# Patient Record
Sex: Male | Born: 1960 | Race: White | Hispanic: No | Marital: Married | Smoking: Former smoker
Health system: Southern US, Community
[De-identification: ages and names within clinical notes are randomized; demographics above are authoritative.]

## PROBLEM LIST (undated history)

## (undated) DIAGNOSIS — I10 Essential (primary) hypertension: Secondary | ICD-10-CM

## (undated) DIAGNOSIS — Z9889 Other specified postprocedural states: Secondary | ICD-10-CM

## (undated) DIAGNOSIS — I4891 Unspecified atrial fibrillation: Secondary | ICD-10-CM

## (undated) DIAGNOSIS — I509 Heart failure, unspecified: Secondary | ICD-10-CM

## (undated) DIAGNOSIS — Z95810 Presence of automatic (implantable) cardiac defibrillator: Secondary | ICD-10-CM

## (undated) DIAGNOSIS — E119 Type 2 diabetes mellitus without complications: Secondary | ICD-10-CM

## (undated) DIAGNOSIS — B1920 Unspecified viral hepatitis C without hepatic coma: Secondary | ICD-10-CM

## (undated) HISTORY — DX: Other specified postprocedural states: Z98.890

## (undated) HISTORY — PX: VALVE REPLACEMENT: SUR13

## (undated) HISTORY — PX: KNEE SURGERY: SHX244

## (undated) HISTORY — DX: Unspecified atrial fibrillation: I48.91

## (undated) HISTORY — DX: Heart failure, unspecified: I50.9

## (undated) HISTORY — PX: ICD IMPLANT: EP1208

## (undated) HISTORY — DX: Essential (primary) hypertension: I10

## (undated) HISTORY — DX: Presence of automatic (implantable) cardiac defibrillator: Z95.810

---

## 2022-05-14 ENCOUNTER — Telehealth: Payer: Self-pay

## 2022-05-14 NOTE — Telephone Encounter (Signed)
NOTES SCANNED TO REFERRAL 

## 2022-05-28 ENCOUNTER — Telehealth: Payer: Self-pay | Admitting: Internal Medicine

## 2022-05-28 ENCOUNTER — Emergency Department (HOSPITAL_COMMUNITY)
Admission: EM | Admit: 2022-05-28 | Discharge: 2022-05-28 | Disposition: A | Discharge status: Home / Self Care | Payer: Self-pay | Attending: Emergency Medicine | Admitting: Emergency Medicine

## 2022-05-28 ENCOUNTER — Emergency Department (HOSPITAL_COMMUNITY): Payer: Self-pay

## 2022-05-28 ENCOUNTER — Other Ambulatory Visit: Payer: Self-pay

## 2022-05-28 ENCOUNTER — Encounter (HOSPITAL_COMMUNITY): Payer: Self-pay

## 2022-05-28 DIAGNOSIS — Z7984 Long term (current) use of oral hypoglycemic drugs: Secondary | ICD-10-CM | POA: Insufficient documentation

## 2022-05-28 DIAGNOSIS — I4892 Unspecified atrial flutter: Secondary | ICD-10-CM

## 2022-05-28 DIAGNOSIS — R0602 Shortness of breath: Secondary | ICD-10-CM | POA: Insufficient documentation

## 2022-05-28 DIAGNOSIS — Z79899 Other long term (current) drug therapy: Secondary | ICD-10-CM | POA: Insufficient documentation

## 2022-05-28 DIAGNOSIS — E119 Type 2 diabetes mellitus without complications: Secondary | ICD-10-CM | POA: Insufficient documentation

## 2022-05-28 DIAGNOSIS — I4891 Unspecified atrial fibrillation: Secondary | ICD-10-CM | POA: Insufficient documentation

## 2022-05-28 DIAGNOSIS — R5383 Other fatigue: Secondary | ICD-10-CM | POA: Insufficient documentation

## 2022-05-28 DIAGNOSIS — R002 Palpitations: Secondary | ICD-10-CM | POA: Insufficient documentation

## 2022-05-28 HISTORY — DX: Type 2 diabetes mellitus without complications: E11.9

## 2022-05-28 HISTORY — DX: Unspecified viral hepatitis C without hepatic coma: B19.20

## 2022-05-28 LAB — TROPONIN I (HIGH SENSITIVITY)
Troponin I (High Sensitivity): 7 ng/L (ref <18)
Troponin I (High Sensitivity): 9 ng/L (ref <18)

## 2022-05-28 LAB — HEPATIC FUNCTION PANEL
ALT: 23 U/L (ref 0–44)
AST: 17 U/L (ref 15–41)
Albumin: 3.8 g/dL (ref 3.5–5.0)
Alkaline Phosphatase: 59 U/L (ref 38–126)
Bilirubin, Direct: 0.1 mg/dL (ref 0.0–0.2)
Indirect Bilirubin: 0.8 mg/dL (ref 0.3–0.9)
Total Bilirubin: 0.9 mg/dL (ref 0.3–1.2)
Total Protein: 7.5 g/dL (ref 6.5–8.1)

## 2022-05-28 LAB — CBC
HCT: 41.3 % (ref 39.0–52.0)
Hemoglobin: 14.3 g/dL (ref 13.0–17.0)
MCH: 32.5 pg (ref 26.0–34.0)
MCHC: 34.6 g/dL (ref 30.0–36.0)
MCV: 93.9 fL (ref 80.0–100.0)
Platelets: 137 10*3/uL — ABNORMAL LOW (ref 150–400)
RBC: 4.4 MIL/uL (ref 4.22–5.81)
RDW: 12.4 % (ref 11.5–15.5)
WBC: 8.2 10*3/uL (ref 4.0–10.5)
nRBC: 0 % (ref 0.0–0.2)

## 2022-05-28 LAB — BASIC METABOLIC PANEL
Anion gap: 10 (ref 5–15)
BUN: 14 mg/dL (ref 8–23)
CO2: 21 mmol/L — ABNORMAL LOW (ref 22–32)
Calcium: 9.1 mg/dL (ref 8.9–10.3)
Chloride: 104 mmol/L (ref 98–111)
Creatinine, Ser: 0.86 mg/dL (ref 0.61–1.24)
GFR, Estimated: >60 mL/min (ref >60)
Glucose, Bld: 124 mg/dL — ABNORMAL HIGH (ref 70–99)
Potassium: 4.1 mmol/L (ref 3.5–5.1)
Sodium: 135 mmol/L (ref 135–145)

## 2022-05-28 LAB — HEPATITIS PANEL, ACUTE
HCV Ab: REACTIVE — AB
Hep A IgM: NONREACTIVE
Hep B C IgM: NONREACTIVE
Hepatitis B Surface Ag: NONREACTIVE

## 2022-05-28 LAB — PROTIME-INR
INR: 1.3 — ABNORMAL HIGH (ref 0.8–1.2)
Prothrombin Time: 15.7 seconds — ABNORMAL HIGH (ref 11.4–15.2)

## 2022-05-28 LAB — D-DIMER, QUANTITATIVE: D-Dimer, Quant: 0.39 ug/mL-FEU (ref 0.00–0.50)

## 2022-05-28 MED ORDER — ETOMIDATE 2 MG/ML IV SOLN
INTRAVENOUS | Status: AC | PRN
  Administered 2022-05-28: 10 mg via INTRAVENOUS

## 2022-05-28 MED ORDER — DILTIAZEM HCL 25 MG/5ML IV SOLN
20.0000 mg | Freq: Once | INTRAVENOUS | Status: AC
  Administered 2022-05-28: 20 mg via INTRAVENOUS
  Filled 2022-05-28: qty 5

## 2022-05-28 MED ORDER — ONDANSETRON HCL 4 MG/2ML IJ SOLN
4.0000 mg | Freq: Once | INTRAMUSCULAR | Status: AC
  Administered 2022-05-28: 4 mg via INTRAVENOUS
  Filled 2022-05-28: qty 2

## 2022-05-28 MED ORDER — LACTATED RINGERS IV BOLUS
1000.0000 mL | Freq: Once | INTRAVENOUS | Status: AC
  Administered 2022-05-28: 1000 mL via INTRAVENOUS

## 2022-05-28 MED ORDER — FENTANYL CITRATE PF 50 MCG/ML IJ SOSY
50.0000 ug | PREFILLED_SYRINGE | Freq: Once | INTRAMUSCULAR | Status: AC
  Administered 2022-05-28: 50 ug via INTRAVENOUS
  Filled 2022-05-28: qty 1

## 2022-05-28 MED ORDER — SODIUM CHLORIDE 0.9 % IV SOLN: INTRAVENOUS | Status: DC

## 2022-05-28 MED ORDER — ETOMIDATE 2 MG/ML IV SOLN
10.0000 mg | Freq: Once | INTRAVENOUS | Status: DC
  Filled 2022-05-28: qty 10

## 2022-05-28 NOTE — Telephone Encounter (Signed)
Spoke with pt who has not yet established care with this cardiology practice.  Pt is scheduled to see Dr Caryl Comes 06/10/2022 to follow his pacemaker.  Pt has recently moved from [place].  Pt states his HR has been around 115 for the past 18 hours.  Pt denies current CP, SOB, dizziness but complains of feeling fatigued.  He states 6 weeks ago he was advised he was in SVT by his device clinic in [place].  He states he takes Lisinopril and Carvedilol.   Pt advised to send a device transmission and contact device clinic in [place] for further direction as to his current rhythm. Pt advised if he develops active CP, SOB, fainting or is advised he is in SVT he will need to go directly to the ED for further evaluation.  Pt verbalizes understanding and will follow up with Cardiology practice accordingly.

## 2022-05-28 NOTE — ED Notes (Signed)
Lab to add on INR

## 2022-05-28 NOTE — Discharge Instructions (Addendum)
You presented with atrial flutter with rapid ventricular response and underwent cardioversion in the emergency department as her symptoms had lasted no more than slightly over 24 hours.  Cardiology will follow-up and see you in clinic next week.  Recommend you restart your home Eliquis and continue your home Coreg.  Return for worsening chest pain, shortness of breath, fatigue, heart palpitations and elevated heart rate. Follow-up on our online portal regarding your hepatitis panel results and discuss with your PCP.

## 2022-05-28 NOTE — Telephone Encounter (Signed)
STAT if HR is under 50 or over 120 (normal HR is 60-100 beats per minute)  What is your heart rate? 115 for the past 15 hours  Do you have a log of your heart rate readings (document readings)? 115 said it's been that way since 3pm yesterday.  Do you have any other symptoms? No other symptoms.

## 2022-05-28 NOTE — ED Notes (Signed)
Patient verbalizes understanding of discharge instructions. Opportunity for questioning and answers were provided. Armband removed by staff, pt discharged from ED pt was wheeled out to the lobby and left in the care of his family.

## 2022-05-28 NOTE — ED Notes (Signed)
Vitals reported at 1838 are incorrect, for different patient

## 2022-05-28 NOTE — ED Triage Notes (Signed)
Pt arrived POV from home c/o his heart racing. Pt states his heart rate has been 120 for the last 24 hrs. Pt states that his chest is sore from it beating so fast but it does not actually hurt. Pt states he recently moved here from [place].

## 2022-05-28 NOTE — ED Notes (Signed)
Signed consent for cardioversion at bedside

## 2022-05-28 NOTE — Sedation Documentation (Signed)
Shock at [facility]

## 2022-05-28 NOTE — ED Provider Notes (Signed)
  [facility] EMERGENCY DEPARTMENT Provider Note   CSN: [id number] Arrival date & time: 05/28/22  1611     History {Add pertinent medical, surgical, social history, OB history to HPI:1} Chief Complaint  Patient presents with   Tachycardia    Corey Perkins is a 61 y.o. male.  HPI     Home Medications Prior to Admission medications   Not on File      Allergies    Heparin    Review of Systems   Review of Systems  Physical Exam Updated Vital Signs BP (!) 124/96   Pulse (!) 118   Temp 98.7 F (37.1 C) (Oral)   Resp 17   Ht 6\' 1"  (1.854 m)   Wt 99.8 kg   SpO2 99%   BMI 29.03 kg/m  Physical Exam  ED Results / Procedures / Treatments   Labs (all labs ordered are listed, but only abnormal results are displayed) Labs Reviewed  BASIC METABOLIC PANEL - Abnormal; Notable for the following components:      Result Value   CO2 21 (*)    Glucose, Bld 124 (*)    All other components within normal limits  CBC - Abnormal; Notable for the following components:   Platelets 137 (*)    All other components within normal limits    EKG EKG Interpretation  Date/Time:  Tuesday May 28 2022 16:18:48 EDT Ventricular Rate:  119 PR Interval:    QRS Duration: 88 QT Interval:  330 QTC Calculation: 464 R Axis:   87 Text Interpretation: Atrial flutter with 2:1 A-V conduction Abnormal ECG No previous ECGs available Confirmed by Regan Lemming ([id number]) on 05/28/2022 7:04:51 PM  Radiology DG Chest 2 View  Result Date: 05/28/2022 CLINICAL DATA:  Tachycardia and chest pain EXAM: CHEST - 2 VIEW COMPARISON:  None Available. FINDINGS: Cardiac and mediastinal contours are within normal limits. Left chest wall AICD with leads projecting over the expected area of the right atrium and right ventricle. Lungs are clear. No pleural effusion or pneumothorax. IMPRESSION: No active cardiopulmonary disease. Electronically Signed   By: Yetta Glassman M.D.   On: 05/28/2022 17:15     Procedures Procedures  {Document cardiac monitor, telemetry assessment procedure when appropriate:1}  Medications Ordered in ED Medications - No data to display  ED Course/ Medical Decision Making/ A&P                           Medical Decision Making Amount and/or Complexity of Data Reviewed Labs: ordered. Radiology: ordered.   ***  {Document critical care time when appropriate:1} {Document review of labs and clinical decision tools ie heart score, Chads2Vasc2 etc:1}  {Document your independent review of radiology images, and any outside records:1} {Document your discussion with family members, caretakers, and with consultants:1} {Document social determinants of health affecting pt's care:1} {Document your decision making why or why not admission, treatments were needed:1} Final Clinical Impression(s) / ED Diagnoses Final diagnoses:  None    Rx / DC Orders ED Discharge Orders     None

## 2022-05-28 NOTE — ED Notes (Signed)
RT called for cardioversion

## 2022-05-28 NOTE — ED Notes (Signed)
Pt placed on pads at this time  

## 2022-05-30 ENCOUNTER — Telehealth: Payer: Self-pay | Admitting: Internal Medicine

## 2022-05-30 LAB — HCV RNA QUANT: HCV Quantitative: NOT DETECTED IU/mL (ref >50)

## 2022-05-30 NOTE — Telephone Encounter (Signed)
Patient called stating he went to the ED the other day and he was in A-flutter they did a cardioversion on him.  They advised him to give [place] a called to see if he needed to be seen before 6/12.

## 2022-05-31 NOTE — Telephone Encounter (Signed)
Spoke with pt who reports he is doing well after his cardioversion in the ED on 5/30.  Pt advised he should plan to keep f/u appointment scheduled for 06/10/2022 with Dr Graciela Husbands.  Pt advised to contact office for any needs between now and the appointment.  Pt verbalizes understanding and agrees with current plan.

## 2022-06-02 ENCOUNTER — Encounter (HOSPITAL_COMMUNITY): Payer: Self-pay

## 2022-06-02 ENCOUNTER — Other Ambulatory Visit: Payer: Self-pay

## 2022-06-02 ENCOUNTER — Inpatient Hospital Stay (HOSPITAL_COMMUNITY)
Admission: EM | Admit: 2022-06-02 | Discharge: 2022-06-06 | DRG: 310 | Disposition: A | Discharge status: Home / Self Care | Payer: BLUE CROSS/BLUE SHIELD | Attending: Cardiovascular Disease | Admitting: Cardiovascular Disease

## 2022-06-02 ENCOUNTER — Inpatient Hospital Stay (HOSPITAL_COMMUNITY): Payer: BLUE CROSS/BLUE SHIELD

## 2022-06-02 DIAGNOSIS — E119 Type 2 diabetes mellitus without complications: Secondary | ICD-10-CM | POA: Diagnosis present

## 2022-06-02 DIAGNOSIS — Z7901 Long term (current) use of anticoagulants: Secondary | ICD-10-CM

## 2022-06-02 DIAGNOSIS — Z833 Family history of diabetes mellitus: Secondary | ICD-10-CM

## 2022-06-02 DIAGNOSIS — I509 Heart failure, unspecified: Secondary | ICD-10-CM | POA: Diagnosis present

## 2022-06-02 DIAGNOSIS — Z7984 Long term (current) use of oral hypoglycemic drugs: Secondary | ICD-10-CM

## 2022-06-02 DIAGNOSIS — I110 Hypertensive heart disease with heart failure: Secondary | ICD-10-CM | POA: Diagnosis present

## 2022-06-02 DIAGNOSIS — Z9889 Other specified postprocedural states: Secondary | ICD-10-CM | POA: Diagnosis not present

## 2022-06-02 DIAGNOSIS — Z8249 Family history of ischemic heart disease and other diseases of the circulatory system: Secondary | ICD-10-CM

## 2022-06-02 DIAGNOSIS — F1011 Alcohol abuse, in remission: Secondary | ICD-10-CM | POA: Diagnosis present

## 2022-06-02 DIAGNOSIS — Z4502 Encounter for adjustment and management of automatic implantable cardiac defibrillator: Secondary | ICD-10-CM | POA: Diagnosis not present

## 2022-06-02 DIAGNOSIS — I484 Atypical atrial flutter: Secondary | ICD-10-CM | POA: Diagnosis present

## 2022-06-02 DIAGNOSIS — Z952 Presence of prosthetic heart valve: Secondary | ICD-10-CM

## 2022-06-02 DIAGNOSIS — Z888 Allergy status to other drugs, medicaments and biological substances status: Secondary | ICD-10-CM

## 2022-06-02 DIAGNOSIS — I4819 Other persistent atrial fibrillation: Principal | ICD-10-CM | POA: Diagnosis present

## 2022-06-02 DIAGNOSIS — I4901 Ventricular fibrillation: Secondary | ICD-10-CM | POA: Diagnosis present

## 2022-06-02 DIAGNOSIS — R9431 Abnormal electrocardiogram [ECG] [EKG]: Secondary | ICD-10-CM

## 2022-06-02 DIAGNOSIS — I428 Other cardiomyopathies: Secondary | ICD-10-CM | POA: Diagnosis present

## 2022-06-02 DIAGNOSIS — I2510 Atherosclerotic heart disease of native coronary artery without angina pectoris: Secondary | ICD-10-CM | POA: Diagnosis present

## 2022-06-02 DIAGNOSIS — Z79899 Other long term (current) drug therapy: Secondary | ICD-10-CM | POA: Diagnosis not present

## 2022-06-02 LAB — CBC
HCT: 38.8 % — ABNORMAL LOW (ref 39.0–52.0)
Hemoglobin: 13.6 g/dL (ref 13.0–17.0)
MCH: 32.5 pg (ref 26.0–34.0)
MCHC: 35.1 g/dL (ref 30.0–36.0)
MCV: 92.6 fL (ref 80.0–100.0)
Platelets: 110 10*3/uL — ABNORMAL LOW (ref 150–400)
RBC: 4.19 MIL/uL — ABNORMAL LOW (ref 4.22–5.81)
RDW: 12.3 % (ref 11.5–15.5)
WBC: 8.2 10*3/uL (ref 4.0–10.5)
nRBC: 0 % (ref 0.0–0.2)

## 2022-06-02 LAB — HEPATIC FUNCTION PANEL
ALT: 25 U/L (ref 0–44)
AST: 18 U/L (ref 15–41)
Albumin: 3.7 g/dL (ref 3.5–5.0)
Alkaline Phosphatase: 61 U/L (ref 38–126)
Bilirubin, Direct: 0.1 mg/dL (ref 0.0–0.2)
Indirect Bilirubin: 0.6 mg/dL (ref 0.3–0.9)
Total Bilirubin: 0.7 mg/dL (ref 0.3–1.2)
Total Protein: 7.5 g/dL (ref 6.5–8.1)

## 2022-06-02 LAB — BASIC METABOLIC PANEL
Anion gap: 7 (ref 5–15)
BUN: 16 mg/dL (ref 8–23)
CO2: 24 mmol/L (ref 22–32)
Calcium: 9.1 mg/dL (ref 8.9–10.3)
Chloride: 102 mmol/L (ref 98–111)
Creatinine, Ser: 0.97 mg/dL (ref 0.61–1.24)
GFR, Estimated: >60 mL/min (ref >60)
Glucose, Bld: 175 mg/dL — ABNORMAL HIGH (ref 70–99)
Potassium: 4 mmol/L (ref 3.5–5.1)
Sodium: 133 mmol/L — ABNORMAL LOW (ref 135–145)

## 2022-06-02 LAB — BRAIN NATRIURETIC PEPTIDE: B Natriuretic Peptide: 105.7 pg/mL — ABNORMAL HIGH (ref 0.0–100.0)

## 2022-06-02 LAB — TROPONIN I (HIGH SENSITIVITY)
Troponin I (High Sensitivity): 11 ng/L (ref <18)
Troponin I (High Sensitivity): 17 ng/L (ref <18)

## 2022-06-02 LAB — TSH: TSH: 3.096 u[IU]/mL (ref 0.350–4.500)

## 2022-06-02 LAB — HIV ANTIBODY (ROUTINE TESTING W REFLEX): HIV Screen 4th Generation wRfx: NONREACTIVE

## 2022-06-02 LAB — CBG MONITORING, ED: Glucose-Capillary: 195 mg/dL — ABNORMAL HIGH (ref 70–99)

## 2022-06-02 LAB — T4, FREE: Free T4: 0.83 ng/dL (ref 0.61–1.12)

## 2022-06-02 LAB — MAGNESIUM: Magnesium: 2 mg/dL (ref 1.7–2.4)

## 2022-06-02 MED ORDER — ONDANSETRON HCL 4 MG/2ML IJ SOLN: 4.0000 mg | Freq: Four times a day (QID) | INTRAMUSCULAR | Status: DC | PRN

## 2022-06-02 MED ORDER — ASPIRIN 81 MG PO CHEW
324.0000 mg | CHEWABLE_TABLET | Freq: Once | ORAL | Status: AC
  Administered 2022-06-02: 324 mg via ORAL
  Filled 2022-06-02: qty 4

## 2022-06-02 MED ORDER — AMIODARONE LOAD VIA INFUSION
150.0000 mg | Freq: Once | INTRAVENOUS | Status: AC
  Administered 2022-06-02: 150 mg via INTRAVENOUS
  Filled 2022-06-02: qty 83.34

## 2022-06-02 MED ORDER — LISINOPRIL 10 MG PO TABS
10.0000 mg | ORAL_TABLET | Freq: Every day | ORAL | Status: DC
  Administered 2022-06-03 – 2022-06-06 (×4): 10 mg via ORAL
  Filled 2022-06-02 (×4): qty 1

## 2022-06-02 MED ORDER — APIXABAN 5 MG PO TABS
5.0000 mg | ORAL_TABLET | Freq: Two times a day (BID) | ORAL | Status: DC
  Administered 2022-06-02 – 2022-06-06 (×8): 5 mg via ORAL
  Filled 2022-06-02 (×8): qty 1

## 2022-06-02 MED ORDER — ACETAMINOPHEN 325 MG PO TABS
650.0000 mg | ORAL_TABLET | ORAL | Status: DC | PRN
  Filled 2022-06-02: qty 2

## 2022-06-02 MED ORDER — AMIODARONE HCL IN DEXTROSE 360-4.14 MG/200ML-% IV SOLN
60.0000 mg/h | INTRAVENOUS | Status: DC
  Administered 2022-06-02: 60 mg/h via INTRAVENOUS
  Filled 2022-06-02: qty 200

## 2022-06-02 MED ORDER — METFORMIN HCL 500 MG PO TABS
1000.0000 mg | ORAL_TABLET | Freq: Two times a day (BID) | ORAL | Status: DC
  Administered 2022-06-02 – 2022-06-06 (×7): 1000 mg via ORAL
  Filled 2022-06-02 (×7): qty 2

## 2022-06-02 MED ORDER — AMIODARONE HCL IN DEXTROSE 360-4.14 MG/200ML-% IV SOLN
30.0000 mg/h | INTRAVENOUS | Status: DC
  Administered 2022-06-02 – 2022-06-05 (×6): 30 mg/h via INTRAVENOUS
  Filled 2022-06-02 (×6): qty 200

## 2022-06-02 MED ORDER — CARVEDILOL 25 MG PO TABS
25.0000 mg | ORAL_TABLET | Freq: Two times a day (BID) | ORAL | Status: DC
  Administered 2022-06-02 – 2022-06-06 (×8): 25 mg via ORAL
  Filled 2022-06-02 (×2): qty 2
  Filled 2022-06-02: qty 1
  Filled 2022-06-02 (×2): qty 2
  Filled 2022-06-02: qty 1
  Filled 2022-06-02 (×2): qty 2

## 2022-06-02 NOTE — Progress Notes (Signed)
  Amiodarone Drug - Drug Interaction Consult Note  Recommendations: Continue as ordered. Monitor for QTc interactions with PRN zofran. Amiodarone is metabolized by the cytochrome P450 system and therefore has the potential to cause many drug interactions. Amiodarone has an average plasma half-life of 50 days (range 20 to 100 days).   There is potential for drug interactions to occur several weeks or months after stopping treatment and the onset of drug interactions may be slow after initiating amiodarone.   []  Statins: Increased risk of myopathy. Simvastatin- restrict dose to 20mg  daily. Other statins: counsel patients to report any muscle pain or weakness immediately.  [x]  Anticoagulants: Amiodarone can increase anticoagulant effect. Consider warfarin dose reduction. Patients should be monitored closely and the dose of anticoagulant altered accordingly, remembering that amiodarone levels take several weeks to stabilize.             Apixaban.  []  Antiepileptics: Amiodarone can increase plasma concentration of phenytoin, the dose should be reduced. Note that small changes in phenytoin dose can result in large changes in levels. Monitor patient and counsel on signs of toxicity.  [x]  Beta blockers: increased risk of bradycardia, AV block and myocardial depression. Sotalol - avoid concomitant use.  []   Calcium channel blockers (diltiazem and verapamil): increased risk of bradycardia, AV block and myocardial depression.  []   Cyclosporine: Amiodarone increases levels of cyclosporine. Reduced dose of cyclosporine is recommended.  []  Digoxin dose should be halved when amiodarone is started.  []  Diuretics: increased risk of cardiotoxicity if hypokalemia occurs.  []  Oral hypoglycemic agents (glyburide, glipizide, glimepiride): increased risk of hypoglycemia. Patient's glucose levels should be monitored closely when initiating amiodarone therapy.   [x]  Drugs that prolong the QT interval:  Torsades  de pointes risk may be increased with concurrent use - avoid if possible.  Monitor QTc, also keep magnesium/potassium WNL if concurrent therapy can't be avoided.  Antibiotics: e.g. fluoroquinolones, erythromycin.  Antiarrhythmics: e.g. quinidine, procainamide, disopyramide, sotalol.  Antipsychotics: e.g. phenothiazines, haloperidol.   Lithium, tricyclic antidepressants, and methadone. Thank You,   06/02/2022 5:12 PM

## 2022-06-02 NOTE — H&P (Signed)
Cardiology Admission History and Physical:   Patient ID: Corey Perkins MRN: [id number]; DOB: 1961/05/16   Admission date: 06/02/2022  PCP:  Pcp, No   [facility] Providers Cardiologist:  None   { Click here to update MD or APP on Care Team, Refresh:1}     Chief Complaint: ICD discharge/atrial flutter  Patient Profile:   Corey Perkins is a 61 y.o. male with history of mitral valve repair and recurrent atrial tachyarrhythmia  who is being seen 06/02/2022 for the evaluation of defibrillator discharge for atrial flutter with 1: 1 AV conduction.  History of Present Illness:   Corey Perkins is originally from [place] but has lived in [place] for the last over 20 years.  He has recently moved back to [place] to be closer to grandchildren.  Around 1415h this afternoon, during light activity he experienced sudden onset of weakness and near syncope after which he received a defibrillator shock.  He "usually feels better after the shock", but this time he does not feel that he has improved.  He has had inappropriate defibrillator shocks for atrial arrhythmia before, in September 2022 (13 consecutive shocks).   Interrogation of his defibrillator shows that he had 3 episodes of nonsustained atrial flutter with 1:1 AV conduction that resolved spontaneously around 1412-1414h this afternoon but then at 1415h he had a sustained episode at 226 bpm, placing him in the detection zone for ventricular fibrillation (greater than 222 bpm).  ATP during charge was unsuccessful and he received a single high-voltage shock with conversion to normal rhythm.  However, about 45 minutes later he again converted to atrial flutter, thankfully this time with 2: 1 AV conduction, which is the rhythm in which he presents in to the emergency room with a ventricular rate of 113 bpm.  This probably explains why he does not feel that he has improved.  He was previously seen in our emergency room on 05/28/2022 with atrial flutter  with rapid ventricular response that has been going on for about 24 hours and underwent semielective cardioversion.  He is due to see Dr. Caryl Comes for his first cardiology appointment since moving to [facility] on 06/10/2022.  He is not yet enrolled in the [facility] heart care device clinic.  Notes that are scanned into our computer show that he had multiple episodes of atrial flutter with rapid ventricular response on May 10 as well.  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  He does not have coronary disease by previous catheterization in 2015 (minor luminal irregularities), although a PET scan in October 2021 did show a very small reversible defect (rest EF 53%, stress EF 64%).  In October 2016 he underwent implantation of a dual-chamber St Jude fortify Assura DR ICD for primary prevention.  There is an alert note in the ICD that he is "atrially pacemaker dependent), but this is not accurate.  He only paces the atrium roughly 60% of the time.  He does not require ventricular pacing.  Around the same time he underwent pulmonary vein isolation ablation.  In September 2022 he underwent another ablation procedure: Cavotricuspid isthmus ablation for typical atrial flutter, which was felt to be the cause of his numerous episodes of inappropriate ICD shocks.  He reports a history of being "allergic" to heparin, a problem that was discovered when he underwent his open heart surgery.  He had to be treated with a different anticoagulant that was "hard to find".  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  This diagnosis is not mentioned in his records.  He has been treated successfully with amiodarone in the past.  The medication was interrupted since it was felt to be no longer necessary.  It was not interrupted due to  side effects.  He has a history of hypertension and alcohol use/abuse and did have elevated LFTs in the past, but not recently. Hep C Ab +ve, RNA undetectable.  He has a history of knee arthroplasty.   Past Medical History:  Diagnosis Date   Diabetes mellitus without complication ([place])    Hepatitis C     Past Surgical History:  Procedure Laterality Date   ICD IMPLANT     KNEE SURGERY     VALVE REPLACEMENT       Medications Prior to Admission: Prior to Admission medications   Medication Sig Start Date End Date Taking? Authorizing Provider  carvedilol (COREG) 25 MG tablet Take 25 mg by mouth 2 (two) times daily. 03/27/22   [provider]  ELIQUIS 5 MG TABS tablet Take 5 mg by mouth 2 (two) times daily. 01/15/22   [provider]  lisinopril (ZESTRIL) 10 MG tablet Take 10 mg by mouth daily. 03/29/22   [provider]  metFORMIN (GLUCOPHAGE) 1000 MG tablet Take 1,000 mg by mouth 2 (two) times daily. 04/21/22   [provider]  Multiple Vitamins-Minerals (CENTRUM MEN) TABS Take 1 tablet by mouth daily.    [provider]     Allergies:    Allergies  Allergen Reactions   Heparin Hives    Social History:   Social History   Socioeconomic History   Marital status: Married    Spouse name: Not on file   Number of children: Not on file   Years of education: Not on file   Highest education level: Not on file  Occupational History   Not on file  Tobacco Use   Smoking status: Never   Smokeless tobacco: Never  Substance and Sexual Activity   Alcohol use: Never   Drug use: Never   Sexual activity: Not on file  Other Topics Concern   Not on file  Social History Narrative   Not on file   Social Determinants of Health   Financial Resource Strain: Not on file  Food Insecurity: Not on file  Transportation Needs: Not on file  Physical Activity: Not on file  Stress: Not on file  Social Connections: Not on file  Intimate Partner  Violence: Not on file    Family History: Father had open heart surgery (CABG) and had diabetes mellitus, mother had hypertension and cancer.   ROS:  Please see the history of present illness.  All other ROS reviewed and negative.     Physical Exam/Data:   Vitals:   06/02/22 1603  BP: (!) 164/102  Pulse: (!) 113  Resp: 16  Temp: 98.9 F (37.2 C)  TempSrc: Oral  SpO2: 100%   No intake or output data in the 24 hours ending 06/02/22 1737    05/28/2022    4:22 PM  Last 3 Weights  Weight (lbs) 220 lb  Weight (kg) 99.791 kg     There is no height or weight on file to calculate BMI.  General:  Well nourished, well developed, in no acute distress.  He appears quite fit.  He is anxious. HEENT: normal Neck:  no JVD Vascular: No carotid bruits; Distal pulses 2+ bilaterally   Cardiac:  normal S1, S2; RRR; no murmur .  He has a well-healed sternotomy scar. Lungs:  clear to auscultation bilaterally, no wheezing, rhonchi or rales  Abd: soft, nontender, no hepatomegaly  Ext: no edema Musculoskeletal:  No deformities, BUE and BLE strength normal and equal Skin: warm and dry  Neuro:  CNs 2-12 intact, no focal abnormalities noted Psych:  Normal affect    EKG:  The ECG that was done  was personally reviewed and demonstrates atrial flutter with 2: 1 AV conduction and delayed R wave progression in the anterior leads.  QTc is borderline prolonged at 480 ms but this is probably exaggerated by the flutter waves.  Relevant CV Studies: Comprehensive interrogation of his pacemaker today shows normal device function.  Estimated battery longevity 2.1 years.  Normal sensing on the atrial and ventricular channels, normal lead impedances.  Did not perform atrial threshold testing due to ongoing atrial flutter.  He only has 60% atrial pacing and 1% ventricular pacing.  The overall burden of atrial tachycardia/atrial fibrillation is 3.9%.  There is a note in the chart that he is "atrially dependent" but  this does not appear to be accurate since he has atrial sensed rhythm documented on multiple recent tracings, presumably sinus rhythm.  Laboratory Data:  High Sensitivity Troponin:   Recent Labs  Lab 05/28/22 1938 05/28/22 2059 06/02/22 1614  TROPONINIHS 7 9 11       Chemistry Recent Labs  Lab 05/28/22 1635 06/02/22 1614  NA 135 133*  K 4.1 4.0  CL 104 102  CO2 21* 24  GLUCOSE 124* 175*  BUN 14 16  CREATININE 0.86 0.97  CALCIUM 9.1 9.1  GFRNONAA >60 >60  ANIONGAP 10 7    Recent Labs  Lab 05/28/22 1938  PROT 7.5  ALBUMIN 3.8  AST 17  ALT 23  ALKPHOS 59  BILITOT 0.9   Lipids No results for input(s): CHOL, TRIG, HDL, LABVLDL, LDLCALC, CHOLHDL in the last 168 hours. Hematology Recent Labs  Lab 05/28/22 1635 06/02/22 1614  WBC 8.2 8.2  RBC 4.40 4.19*  HGB 14.3 13.6  HCT 41.3 38.8*  MCV 93.9 92.6  MCH 32.5 32.5  MCHC 34.6 35.1  RDW 12.4 12.3  PLT 137* 110*   Thyroid No results for input(s): TSH, FREET4 in the last 168 hours. BNPNo results for input(s): BNP, PROBNP in the last 168 hours.  DDimer  Recent Labs  Lab 05/28/22 1938  DDIMER 0.39     Radiology/Studies:  No results found.   Assessment and Plan:   Atypical atrial flutter: He has previously undergone both pulmonary vein isolation and cavotricuspid isthmus ablation.  I suspect that he has "scar" flutter related to his previous atriotomy for mitral valve repair.  He may be a candidate for another ablation procedure.  For tonight we will put him on amiodarone for rate control, hopefully also return to normal rhythm.  He has taken amiodarone in the past without side effects.  He is on chronic Eliquis anticoagulation.  We will check liver function tests and thyroid function tests. CHF: He has a history of moderately depressed LVEF of 35%, but after mitral valve repair and treatment of his arrhythmia his most recent EF was documented at 50-55%.  He is on high-dose carvedilol as well as lisinopril.  He  does not have clinical heart failure.  Clinically euvolemic, NYHA functional class I. S/p MVRepair: Echo from 2021 reports trace  mitral insufficiency and no mitral stenosis.  We will order an echocardiogram here as well. ICD: He has had repeated shocks for atrial flutter with 1: 1 conduction back in September 2021 and again today.  The device is programmed with a VF zone at 222 bpm and occasionally the flutter rate of up to 226 bpm exceeds that threshold.  I did not make any changes to the defibrillator settings today since he seems to be well settled in flutter with 2: 1 AV conduction and we will start amiodarone.  EP consultation in the morning. DM: Controlled with metformin monotherapy.  We will check a hemoglobin A1c. HTN: Severely elevated blood pressure, but he is clearly anxious following his defibrillator shock and is apprehensive that it will happen again.  Extensive notes from his cardiology team in [place] are in the computer under the media tab dated 05/14/2022.   Risk Assessment/Risk Scores:  {Complete the following score calculators/questions to meet required metrics.  Press F2:1}       99991111 Score = 3  {Click here to calculate score.  REFRESH note before signing. :1} This indicates a 3.2% annual risk of stroke. The patient's score is based upon: CHF History: 1 HTN History: 1 Diabetes History: 1 Stroke History: 0 Vascular Disease History: 0 Age Score: 0 Gender Score: 0   {This patient has a significant risk of stroke if diagnosed with atrial fibrillation.  Please consider VKA or DOAC agent for anticoagulation if the bleeding risk is acceptable.   You can also use the SmartPhrase .[place] for documentation.   [id number]   Severity of Illness: The appropriate patient status for this patient is INPATIENT. Inpatient status is judged to be reasonable and necessary in order to provide the required intensity of service to ensure the patient's safety. The patient's  presenting symptoms, physical exam findings, and initial radiographic and laboratory data in the context of their chronic comorbidities is felt to place them at high risk for further clinical deterioration. Furthermore, it is not anticipated that the patient will be medically stable for discharge from the hospital within 2 midnights of admission.   * I certify that at the point of admission it is my clinical judgment that the patient will require inpatient hospital care spanning beyond 2 midnights from the point of admission due to high intensity of service, high risk for further deterioration and high frequency of surveillance required.*   For questions or updates, please contact [place] Please consult [email or web address] for contact info under     Signed, Sanda Klein, MD  06/02/2022 5:37 PM

## 2022-06-02 NOTE — ED Triage Notes (Signed)
Reports chest pain that started around 2pm.   Reports defib went off.  Usually feels better but has not got to feeling better.  Patient reports dizziness when he walks around.

## 2022-06-02 NOTE — ED Provider Notes (Signed)
[facility] EMERGENCY DEPARTMENT Provider Note   CSN: [id number] Arrival date & time: 06/02/22  1532     History  Chief Complaint  Patient presents with   Chest Pain   AICD Problem    Corey Perkins is a 61 y.o. male.  61 yo M with a chief complaints of his ICD firing.  The patient said he was doing yard work today as it was a little short of breath and a little bit sweaty and suddenly felt like he might pass out and then felt his device go off.  Since then he has not felt quite right.  He does feel short of breath as he is just sitting in the room.  Denies any chest pain or pressure currently.  Had been feeling okay prior to the event.   Chest Pain     Home Medications Prior to Admission medications   Medication Sig Start Date End Date Taking? Authorizing Provider  carvedilol (COREG) 25 MG tablet Take 25 mg by mouth 2 (two) times daily. 03/27/22   [provider]  ELIQUIS 5 MG TABS tablet Take 5 mg by mouth 2 (two) times daily. 01/15/22   [provider]  lisinopril (ZESTRIL) 10 MG tablet Take 10 mg by mouth daily. 03/29/22   [provider]  metFORMIN (GLUCOPHAGE) 1000 MG tablet Take 1,000 mg by mouth 2 (two) times daily. 04/21/22   [provider]  Multiple Vitamins-Minerals (CENTRUM MEN) TABS Take 1 tablet by mouth daily.    [provider]      Allergies    Heparin    Review of Systems   Review of Systems  Cardiovascular:  Positive for chest pain.   Physical Exam Updated Vital Signs BP (!) 164/102 (BP Location: Right Arm)   Pulse (!) 113   Temp 98.9 F (37.2 C) (Oral)   Resp 16   SpO2 100%  Physical Exam Vitals and nursing note reviewed.  Constitutional:      Appearance: He is well-developed.  HENT:     Head: Normocephalic and atraumatic.  Eyes:     Pupils: Pupils are equal, round, and reactive to light.  Neck:     Vascular: No JVD.  Cardiovascular:     Rate and Rhythm: Normal rate and regular  rhythm.     Heart sounds: No murmur heard.   No friction rub. No gallop.  Pulmonary:     Effort: No respiratory distress.     Breath sounds: No wheezing.  Abdominal:     General: There is no distension.     Tenderness: There is no abdominal tenderness. There is no guarding or rebound.  Musculoskeletal:        General: Normal range of motion.     Cervical back: Normal range of motion and neck supple.  Skin:    Coloration: Skin is not pale.     Findings: No rash.  Neurological:     Mental Status: He is alert and oriented to person, place, and time.  Psychiatric:        Behavior: Behavior normal.    ED Results / Procedures / Treatments   Labs (all labs ordered are listed, but only abnormal results are displayed) Labs Reviewed  BASIC METABOLIC PANEL - Abnormal; Notable for the following components:      Result Value   Sodium 133 (*)    Glucose, Bld 175 (*)    All other components within normal limits  CBC - Abnormal; Notable for  the following components:   RBC 4.19 (*)    HCT 38.8 (*)    Platelets 110 (*)    All other components within normal limits  BRAIN NATRIURETIC PEPTIDE - Abnormal; Notable for the following components:   B Natriuretic Peptide 105.7 (*)    All other components within normal limits  CBG MONITORING, ED - Abnormal; Notable for the following components:   Glucose-Capillary 195 (*)    All other components within normal limits  MAGNESIUM  HIV ANTIBODY (ROUTINE TESTING W REFLEX)  TSH  T4, FREE  HEPATIC FUNCTION PANEL  TROPONIN I (HIGH SENSITIVITY)  TROPONIN I (HIGH SENSITIVITY)    EKG EKG Interpretation  Date/Time:  Sunday June 02 2022 16:10:57 EDT Ventricular Rate:  113 PR Interval:  174 QRS Duration: 90 QT Interval:  356 QTC Calculation: 488 R Axis:   96 Text Interpretation: Sinus tachycardia Rightward axis Anteroseptal infarct , possibly acute Abnormal ECG ST elevation in the anterior leads <75mm Otherwise no significant change Confirmed by  Deno Etienne [phone]) on 06/02/2022 5:09:53 PM  Radiology No results found.  Procedures Procedures    Medications Ordered in ED Medications  amiodarone (NEXTERONE PREMIX) 360-4.14 MG/200ML-% (1.8 mg/mL) IV infusion (60 mg/hr Intravenous New Bag/Given 06/02/22 1730)    Followed by  amiodarone (NEXTERONE PREMIX) 360-4.14 MG/200ML-% (1.8 mg/mL) IV infusion (has no administration in time range)  carvedilol (COREG) tablet 25 mg (has no administration in time range)  apixaban (ELIQUIS) tablet 5 mg (has no administration in time range)  lisinopril (ZESTRIL) tablet 10 mg (has no administration in time range)  metFORMIN (GLUCOPHAGE) tablet 1,000 mg (has no administration in time range)  acetaminophen (TYLENOL) tablet 650 mg (has no administration in time range)  ondansetron (ZOFRAN) injection 4 mg (has no administration in time range)  aspirin chewable tablet 324 mg (324 mg Oral Given 06/02/22 1726)  amiodarone (NEXTERONE) 1.8 mg/mL load via infusion 150 mg (150 mg Intravenous Bolus from Bag 06/02/22 1729)    ED Course/ Medical Decision Making/ A&P                           Medical Decision Making Amount and/or Complexity of Data Reviewed Labs: ordered. Radiology: ordered.  Risk OTC drugs. Decision regarding hospitalization.   61 yo M with a chief complaints of having his ICD firing and not feeling well.  I was notified by the triage nurse that he had an EKG read as a ST elevation MI by the machine.  On my review of the EKG I did not see an obvious STEMI but did see that he had changes to his EKG from prior.  I discussed this with Dr. Saunders Revel, interventional cardiology recommended cardiology evaluation at bedside and having his device interrogated.  I discussed case with Dr. Harl Bowie who will come and evaluate the patient at bedside.  Patient troponin negative no significant electrolyte abnormality not anemic BNP mildly elevated at 100.  Cardiology to admit.  CRITICAL CARE Performed by: Cecilio Asper   Total critical care time: 35 minutes  Critical care time was exclusive of separately billable procedures and treating other patients.  Critical care was necessary to treat or prevent imminent or life-threatening deterioration.  Critical care was time spent personally by me on the following activities: development of treatment plan with patient and/or surrogate as well as nursing, discussions with consultants, evaluation of patient's response to treatment, examination of patient, obtaining history from patient or surrogate, ordering  and performing treatments and interventions, ordering and review of laboratory studies, ordering and review of radiographic studies, pulse oximetry and re-evaluation of patient's condition.  The patients results and plan were reviewed and discussed.   Any x-rays performed were independently reviewed by myself.   Differential diagnosis were considered with the presenting HPI.  Medications  amiodarone (NEXTERONE PREMIX) 360-4.14 MG/200ML-% (1.8 mg/mL) IV infusion (60 mg/hr Intravenous New Bag/Given 06/02/22 1730)    Followed by  amiodarone (NEXTERONE PREMIX) 360-4.14 MG/200ML-% (1.8 mg/mL) IV infusion (has no administration in time range)  carvedilol (COREG) tablet 25 mg (has no administration in time range)  apixaban (ELIQUIS) tablet 5 mg (has no administration in time range)  lisinopril (ZESTRIL) tablet 10 mg (has no administration in time range)  metFORMIN (GLUCOPHAGE) tablet 1,000 mg (has no administration in time range)  acetaminophen (TYLENOL) tablet 650 mg (has no administration in time range)  ondansetron (ZOFRAN) injection 4 mg (has no administration in time range)  aspirin chewable tablet 324 mg (324 mg Oral Given 06/02/22 1726)  amiodarone (NEXTERONE) 1.8 mg/mL load via infusion 150 mg (150 mg Intravenous Bolus from Bag 06/02/22 1729)    Vitals:   06/02/22 1603  BP: (!) 164/102  Pulse: (!) 113  Resp: 16  Temp: 98.9 F (37.2 C)   TempSrc: Oral  SpO2: 100%    Final diagnoses:  Atypical atrial flutter (HCC)  AICD discharge  Abnormal ECG    Admission/ observation were discussed with the admitting physician, patient and/or family and they are comfortable with the plan.         Final Clinical Impression(s) / ED Diagnoses Final diagnoses:  Atypical atrial flutter ([place])  AICD discharge  Abnormal ECG    Rx / DC Orders ED Discharge Orders          Ordered    Amb referral to AFIB Clinic        06/02/22 [place], DO 06/02/22 1752

## 2022-06-03 ENCOUNTER — Inpatient Hospital Stay (HOSPITAL_COMMUNITY): Payer: BLUE CROSS/BLUE SHIELD

## 2022-06-03 ENCOUNTER — Other Ambulatory Visit (HOSPITAL_COMMUNITY): Payer: Self-pay

## 2022-06-03 DIAGNOSIS — Z95810 Presence of automatic (implantable) cardiac defibrillator: Secondary | ICD-10-CM | POA: Insufficient documentation

## 2022-06-03 DIAGNOSIS — I484 Atypical atrial flutter: Secondary | ICD-10-CM | POA: Diagnosis not present

## 2022-06-03 LAB — ECHOCARDIOGRAM COMPLETE
AR max vel: 3.89 cm2
AV Area VTI: 3.29 cm2
AV Area mean vel: 3.46 cm2
AV Mean grad: 2 mmHg
AV Peak grad: 2.7 mmHg
Ao pk vel: 0.82 m/s
Calc EF: 46.3 %
Height: 73 in
MV VTI: 3.25 cm2
S' Lateral: 3.7 cm
Single Plane A2C EF: 38.3 %
Single Plane A4C EF: 54.8 %
Weight: 3516.78 oz

## 2022-06-03 NOTE — Plan of Care (Signed)

## 2022-06-03 NOTE — Consult Note (Addendum)
Cardiology Consultation:   Patient ID: Corey Perkins MRN: [id number]; DOB: Oct 01, 1961  Admit date: 06/02/2022 Date of Consult: 06/03/2022  PCP:  Corey Perkins No   [facility] Providers Cardiologist:     Patient Profile:   Corey Perkins is a 61 y.o. male with a hx of NICM (in setting of ETOH abuse that has recovered), VHD (s/p MV ring 2016), non-obstructive CAD, AFib (described as persistent w/hx of PVI ablation), Aflutter (s/p CTI ablation), ICD (St Jude, 2016), HTN,  hx of elevated LFTs in the past, but not recently. Hep C Ab +ve, RNA undetectable who is being seen 06/03/2022 for the evaluation of recurrent Aflutter w/RVR at the request of Dr. Sallyanne Perkins.  History of Present Illness:   Corey Perkins recently moved from [place] to be near family, with plans to establish with our clinic, though had not yet been seen.  He had an ER visit 05/28/22 with tachycardia of about 24 hours associated with mild SOB, suspected he was in AFib, EKG was c/w AFlutter, given a bolus of diltiazem and  ultimately underwent DCCV in the ER, seems he had been off his Eliquis of late with some hemoptysis, though instructed to resume and discharged from the ER  He returns yesterday after being shocked by his device.  He was seen by Dr. Sallyanne Perkins his note reported device interrogation Interrogation of his defibrillator shows that he had 3 episodes of nonsustained atrial flutter with 1:1 AV conduction that resolved spontaneously around 1412-1414h this afternoon but then at 1415h he had a sustained episode at 226 bpm, placing him in the detection zone for ventricular fibrillation (greater than 222 bpm).  ATP during charge was unsuccessful and he received a single high-voltage shock with conversion to normal rhythm. However, about 45 minutes later he again converted to atrial flutter, thankfully this time with 2: 1 AV conduction, which is the rhythm in which he presents in to the emergency room with a ventricular rate of 113 bpm. He also  makes mention of reprted atrially dependent though was not the case by his observation  He was admitted for further management, AFlutter atypical, and likely 2/2 MV surgery, not felt to be volume OL ICD shocks inappropriate for AFlutter, and noted  His VF zone at 222 bpm and occasionally the flutter rate of up to 226 bpm exceeds that threshold.   No defibrillator settings were made since he seems to be well settled in flutter with 2: 1 AV conduction and started on amiodarone (which he had been on before stopped post ablation) and plans for EP consult in the AM  LABS K+ 4.0 BUN/Creat 16/0.97 LFTs wnl HS Trop 11, 17 BNP 105 WBC 8.2 H/H 13/38 Plts 110 TSH 3.096  His device interrogation is not available for personal review  He tells me that 2016 his 1st ablation, something went wrong and that is how he ended up with the ICD (this timing is unclear and he can not recall the specifics), but did well from an arrhythmia standpoint for years afterwards. Last year everything "fell apart" and he started having trouble with rhythm again, once got 6 shocks back to back) his 2nd ablation (by notes the CTI) sept 2022, did OK until a couple weeks ago, he was called by his [place] cardiologist who told him he was having an SVT, but made no recommendations, and his symptoms seemed to improve. Last week developed the tachycardia again, makes him feel weak, poorly, yesterday he though he may pass out if it got  worse. No CP  In regards to a mention of hemoptysis, this is less clear he had an incomplete EGD last year 2/2 food in his stomach. He denies coughing up blood. He wakes with a bad taste in his mouth, when he rinses out his muth and spits is is blood tinged.  Uncertain if his gums are bleeding at night, perhaps, no bloody noses, does not think there is sinus drainage (post nasal). When on Eliquis when he flosses, his gums do bleed.  Past Medical History:  Diagnosis Date   Diabetes mellitus without  complication ([place])    Hepatitis C     Past Surgical History:  Procedure Laterality Date   ICD IMPLANT     KNEE SURGERY     VALVE REPLACEMENT       Home Medications:  Prior to Admission medications   Medication Sig Start Date End Date Taking? Authorizing Provider  carvedilol (COREG) 25 MG tablet Take 25 mg by mouth 2 (two) times daily. 03/27/22  Yes [provider]  ELIQUIS 5 MG TABS tablet Take 5 mg by mouth 2 (two) times daily. 01/15/22  Yes [provider]  lisinopril (ZESTRIL) 10 MG tablet Take 10 mg by mouth daily. 03/29/22  Yes [provider]  metFORMIN (GLUCOPHAGE) 1000 MG tablet Take 1,000 mg by mouth 2 (two) times daily. 04/21/22  Yes [provider]  Multiple Vitamins-Minerals (CENTRUM MEN) TABS Take 1 tablet by mouth daily.   Yes [provider]    Inpatient Medications: Scheduled Meds:  apixaban  5 mg Oral BID   carvedilol  25 mg Oral BID   lisinopril  10 mg Oral Daily   metFORMIN  1,000 mg Oral BID   Continuous Infusions:  amiodarone 30 mg/hr (06/03/22 0324)   PRN Meds: acetaminophen, ondansetron (ZOFRAN) IV  Allergies:    Allergies  Allergen Reactions   Heparin Hives    Social History:   Social History   Socioeconomic History   Marital status: Married    Spouse name: Not on file   Number of children: Not on file   Years of education: Not on file   Highest education level: Not on file  Occupational History   Not on file  Tobacco Use   Smoking status: Never   Smokeless tobacco: Never  Substance and Sexual Activity   Alcohol use: Never   Drug use: Never   Sexual activity: Not on file  Other Topics Concern   Not on file  Social History Narrative   Not on file   Social Determinants of Health   Financial Resource Strain: Not on file  Food Insecurity: Not on file  Transportation Needs: Not on file  Physical Activity: Not on file  Stress: Not on file  Social Connections: Not on file  Intimate Partner  Violence: Not on file    Family History:   Father (CABG, DM), Mother (HTN, cancer)  ROS:  Please see the history of present illness.  All other ROS reviewed and negative.     Physical Exam/Data:   Vitals:   06/03/22 0052 06/03/22 0549 06/03/22 0820 06/03/22 0900  BP: (!) 132/91 (!) 148/98 (!) 141/97 (!) 157/108  Pulse: (!) 107 (!) 105 (!) 108   Resp: 18 18 15    Temp: 98.6 F (37 C) 98 F (36.7 C) 98.2 F (36.8 C)   TempSrc: Oral Oral Oral   SpO2: 99% 99% 98%   Weight:      Height:  Intake/Output Summary (Last 24 hours) at 06/03/2022 1052 Last data filed at 06/03/2022 0400 Gross per 24 hour  Intake 347.12 ml  Output --  Net 347.12 ml      06/02/2022    8:00 PM 05/28/2022    4:22 PM  Last 3 Weights  Weight (lbs) 219 lb 12.8 oz 220 lb  Weight (kg) 99.7 kg 99.791 kg     Body mass index is 29 kg/m.  General:  Well nourished, well developed, in no acute distress HEENT: normal Neck: no JVD Vascular: No carotid bruits; Distal pulses 2+ bilaterally Cardiac:  RRR; tachycardic, no murmurs, gallops or rubs Lungs:  clear to auscultation bilaterally, no wheezing, rhonchi or rales  Abd: soft, nontender, no hepatomegaly  Ext: no edema Musculoskeletal:  No deformities Skin: warm and dry  Neuro:  no gross focal motor abnormalities noted Psych:  Normal affect   EKG:  The EKG was personally reviewed and demonstrates:   Atypical AFlutter 113bpm  Telemetry:  Telemetry was personally reviewed and demonstrates:   Remains in Aflutter 100's-110  Relevant CV Studies:  09/12/2020: TTE, LVEF 50-55%, mod conc LVH, trace AI, trace MR 09/29/2020: cardiac PET/CT Severe multivessel calcium EF 53% rest/stress 64%, normal perfusion at rest, reversible defect stress mid sepum, small , no transient dilation, summed difference score was one Overall low risk 09/13/21: CTI ablation 2015 cath with NOD  Laboratory Data:  High Sensitivity Troponin:   Recent Labs  Lab 05/28/22 1938  05/28/22 2059 06/02/22 1614 06/02/22 1854  TROPONINIHS 7 9 11 17      Chemistry Recent Labs  Lab 05/28/22 1635 06/02/22 1614 06/02/22 1854  NA 135 133*  --   K 4.1 4.0  --   CL 104 102  --   CO2 21* 24  --   GLUCOSE 124* 175*  --   BUN 14 16  --   CREATININE 0.86 0.97  --   CALCIUM 9.1 9.1  --   MG  --   --  2.0  GFRNONAA >60 >60  --   ANIONGAP 10 7  --     Recent Labs  Lab 05/28/22 1938 06/02/22 1854  PROT 7.5 7.5  ALBUMIN 3.8 3.7  AST 17 18  ALT 23 25  ALKPHOS 59 61  BILITOT 0.9 0.7   Lipids No results for input(s): CHOL, TRIG, HDL, LABVLDL, LDLCALC, CHOLHDL in the last 168 hours.  Hematology Recent Labs  Lab 05/28/22 1635 06/02/22 1614  WBC 8.2 8.2  RBC 4.40 4.19*  HGB 14.3 13.6  HCT 41.3 38.8*  MCV 93.9 92.6  MCH 32.5 32.5  MCHC 34.6 35.1  RDW 12.4 12.3  PLT 137* 110*   Thyroid  Recent Labs  Lab 06/02/22 1854  TSH 3.096  FREET4 0.83    BNP Recent Labs  Lab 06/02/22 1614  BNP 105.7*    DDimer  Recent Labs  Lab 05/28/22 1938  DDIMER 0.39     Radiology/Studies:  DG Chest 2 View  Result Date: 06/02/2022 CLINICAL DATA:  cp EXAM: CHEST - 2 VIEW COMPARISON:  None Available. FINDINGS: Left chest wall dual lead pacemaker/defibrillator in stable position. The heart and mediastinal contours are unchanged. No focal consolidation. No pulmonary edema. No pleural effusion. No pneumothorax. No acute osseous abnormality.  Query fractured mid sternotomy wires. IMPRESSION: No active cardiopulmonary disease. Electronically Signed   By: Iven Finn M.D.   On: 06/02/2022 21:25     Assessment and Plan:   Inappropriate ICD shock (by record) Atypical  Aflutter Persistent AFib CHA2DS2Vasc is 3 (Including some degree of non-obstructive CAD), on Eliquis Remains out of rhythm despite ICD shock Amiodarone gtt started  He has been on amiodarone in the past and by notes stopped post ablation felt no longer needed. He is young, has some hx of abn LFTs  (though not persistently) with hx of Hep C (treated), may not be the best agent long term again for him. I would not re-attempt DCCV until an AAD is loaded. For now, will keep amio with his coreg until further discussion with MD   VHD Hx of MV repair (ring) Stable by his last echo  NICM by hx  w/recovered LVEF by last echo  Not felt to be volume OL On BB/ACE    Risk Assessment/Risk Scores:     For questions or updates, please contact [facility] Please consult [email or web address] for contact info under    Signed, Baldwin Jamaica, PA-C  06/03/2022 10:52 AM   Atrial fibrillation with inappropriate shocks  Atypical atrial flutter with inappropriate shocks  Nonischemic heart disease with mitral valve ring 2016; EF 40-45% 6/23  ICD for primary prevention-Saint Jude 2016  Hepatitis C  Thromboembolic risk factors , HTN-1, DM-1, CHF-1) for a CHADSVASc Score of >=3   The patient has persistent atrial flutter-atypical which recurs shortly after cardioversion resulting in inappropriate shock.  In the short-term I think amiodarone is a reasonable drug to try to slow the atrial flutter down, more importantly will be reprogramming the ICD above his atrial flutter rate.  I will asked Dr. Quentin Ore to see him tomorrow to consider the operative duties of repeat ablation for his atypical flutter.  I think in this young man this would be the ideal strategy.  Alternatively an antiarrhythmic drug strategy would be reasonable.  We could consider even dofetilide in the short-term if the amiodarone dosing is not too long.  He needs augmented therapy for his cardiomyopathy.  We will begin spironolactone 25 mg daily and he is also a candidate for an SGLT2.

## 2022-06-03 NOTE — Progress Notes (Signed)
Pt AM EKG reading critical - ST elevation/new infarct. I reviewed the EKG from yesterday and the values and rhythm look similar. Pt is asymptomatic. Paged Dr. Cherly Beach so he could analyze the EKGs and he stated this morning looks about the same as yesterday d/t the 2:1/1:1 Aflutter rhythm he is currently in. Will continue to monitor.

## 2022-06-03 NOTE — TOC Benefit Eligibility Note (Addendum)
Patient Product/process development scientist completed.    The patient is currently admitted and upon discharge could be taking Eliquis 5 mg.  The current 30 day co-pay is, $550.09 due to a $2,500.00 deductible.   The patient is currently admitted and upon discharge could be taking Farxiga 10 mg.  The current 30 day co-pay is, $554.53 due to a $2,500.00 deductible.   The patient is currently admitted and upon discharge could be taking Jardiance 10 mg.  The current 30 day co-pay is, $581.96 due to a $2,500.00 deductible.   The patient is insured through [employer]     Corey Perkins, CPhT Pharmacy Patient Advocate Specialist [facility] Patient Advocate Team Direct Number: [phone]  Fax: [phone]

## 2022-06-03 NOTE — Care Management (Addendum)
  Transition of Care [facility]) Screening Note   Patient Details  Name: Corey Perkins Date of Birth: 1961-03-14   Transition of Care [facility]) CM/SW Contact:    Gala Lewandowsky, RN Phone Number: 06/03/2022, 2:00 PM    Transition of Care Department [facility]) has reviewed the patient and no TOC needs have been identified at this time. We will continue to monitor patient advancement through interdisciplinary progression rounds. If new patient transition needs arise, please place a TOC consult.  1402 06-03-22 Benefits check submitted for Eliquis.

## 2022-06-04 LAB — BASIC METABOLIC PANEL
Anion gap: 3 — ABNORMAL LOW (ref 5–15)
BUN: 19 mg/dL (ref 8–23)
CO2: 26 mmol/L (ref 22–32)
Calcium: 8.8 mg/dL — ABNORMAL LOW (ref 8.9–10.3)
Chloride: 107 mmol/L (ref 98–111)
Creatinine, Ser: 1.17 mg/dL (ref 0.61–1.24)
GFR, Estimated: >60 mL/min (ref >60)
Glucose, Bld: 136 mg/dL — ABNORMAL HIGH (ref 70–99)
Potassium: 3.9 mmol/L (ref 3.5–5.1)
Sodium: 136 mmol/L (ref 135–145)

## 2022-06-04 LAB — MAGNESIUM: Magnesium: 2.1 mg/dL (ref 1.7–2.4)

## 2022-06-04 LAB — HEMOGLOBIN A1C
Hgb A1c MFr Bld: 5.9 % — ABNORMAL HIGH (ref 4.8–5.6)
Mean Plasma Glucose: 122.63 mg/dL

## 2022-06-04 LAB — LIPID PANEL
Cholesterol: 191 mg/dL (ref 0–200)
HDL: 36 mg/dL — ABNORMAL LOW (ref >40)
LDL Cholesterol: 118 mg/dL — ABNORMAL HIGH (ref 0–99)
Total CHOL/HDL Ratio: 5.3 RATIO
Triglycerides: 187 mg/dL — ABNORMAL HIGH (ref <150)
VLDL: 37 mg/dL (ref 0–40)

## 2022-06-04 NOTE — Plan of Care (Signed)

## 2022-06-04 NOTE — Progress Notes (Addendum)
Progress Note  Patient Name: Corey Perkins Date of Encounter: 06/04/2022  [facility] HeartCare Cardiologist: new to [facility] (and [place])  Subjective   Doing OK, no CP, no SOB, hoping for a durable solution to his rhythm  Inpatient Medications    Scheduled Meds:  apixaban  5 mg Oral BID   carvedilol  25 mg Oral BID   lisinopril  10 mg Oral Daily   metFORMIN  1,000 mg Oral BID   Continuous Infusions:  amiodarone 30 mg/hr (06/04/22 0258)   PRN Meds: acetaminophen, ondansetron (ZOFRAN) IV   Vital Signs    Vitals:   06/03/22 0820 06/03/22 0900 06/03/22 2016 06/04/22 0437  BP: (!) 141/97 (!) 157/108 (!) 172/103 127/87  Pulse: (!) 108   (!) 102  Resp: 15     Temp: 98.2 F (36.8 C)  97.9 F (36.6 C) 98 F (36.7 C)  TempSrc: Oral  Oral Oral  SpO2: 98%  99%   Weight:      Height:        Intake/Output Summary (Last 24 hours) at 06/04/2022 0752 Last data filed at 06/04/2022 0300 Gross per 24 hour  Intake 371.55 ml  Output --  Net 371.55 ml      06/02/2022    8:00 PM 05/28/2022    4:22 PM  Last 3 Weights  Weight (lbs) 219 lb 12.8 oz 220 lb  Weight (kg) 99.7 kg 99.791 kg      Telemetry    Remains in AFlutter, 100's - Personally Reviewed  ECG    No new EKGs - Personally Reviewed  Physical Exam   GEN: No acute distress.   Neck: No JVD Cardiac: RRR, tachycardic, no murmurs, rubs, or gallops.  Respiratory: CTA b/l. GI: Soft, nontender, non-distended  MS: No edema; No deformity. Neuro:  Nonfocal  Psych: Normal affect   Labs    High Sensitivity Troponin:   Recent Labs  Lab 05/28/22 1938 05/28/22 2059 06/02/22 1614 06/02/22 1854  TROPONINIHS 7 9 11 17      Chemistry Recent Labs  Lab 05/28/22 1635 05/28/22 1938 06/02/22 1614 06/02/22 1854 06/04/22 0403  NA 135  --  133*  --  136  K 4.1  --  4.0  --  3.9  CL 104  --  102  --  107  CO2 21*  --  24  --  26  GLUCOSE 124*  --  175*  --  136*  BUN 14  --  16  --  19  CREATININE 0.86  --  0.97  --  1.17   CALCIUM 9.1  --  9.1  --  8.8*  MG  --   --   --  2.0 2.1  PROT  --  7.5  --  7.5  --   ALBUMIN  --  3.8  --  3.7  --   AST  --  17  --  18  --   ALT  --  23  --  25  --   ALKPHOS  --  59  --  61  --   BILITOT  --  0.9  --  0.7  --   GFRNONAA >60  --  >60  --  >60  ANIONGAP 10  --  7  --  3*    Lipids  Recent Labs  Lab 06/04/22 0403  CHOL 191  TRIG 187*  HDL 36*  LDLCALC 118*  CHOLHDL 5.3    Hematology Recent Labs  Lab 05/28/22  1635 06/02/22 1614  WBC 8.2 8.2  RBC 4.40 4.19*  HGB 14.3 13.6  HCT 41.3 38.8*  MCV 93.9 92.6  MCH 32.5 32.5  MCHC 34.6 35.1  RDW 12.4 12.3  PLT 137* 110*   Thyroid  Recent Labs  Lab 06/02/22 1854  TSH 3.096  FREET4 0.83    BNP Recent Labs  Lab 06/02/22 1614  BNP 105.7*    DDimer  Recent Labs  Lab 05/28/22 1938  DDIMER 0.39     Radiology     Cardiac Studies   06/03/22: TTE 1. Left ventricular ejection fraction, by estimation, is 40 to 45%. The  left ventricle has mildly decreased function. The left ventricle  demonstrates global hypokinesis. There is mild concentric left ventricular  hypertrophy. Indeterminate diastolic  filling due to E-A fusion.   2. Right ventricular systolic function is mildly reduced. The right  ventricular size is normal.   3. The mitral valve has been repaired/replaced. No evidence of mitral  valve regurgitation. No evidence of mitral stenosis. There is a prosthetic  annuloplasty ring present in the mitral position.   4. Tricuspid valve regurgitation is mild to moderate.   5. The aortic valve is tricuspid. Aortic valve regurgitation is not  visualized. Aortic valve sclerosis is present, with no evidence of aortic  valve stenosis.   6. There is borderline dilatation of the aortic root, measuring 37 mm.  There is mild dilatation of the ascending aorta, measuring 38 mm.   7. The inferior vena cava is normal in size with greater than 50%  respiratory variability, suggesting right atrial pressure  of 3 mmHg.   Comparison(s): No prior Echocardiogram.    09/12/2020: TTE, LVEF 50-55%, mod conc LVH, trace AI, trace MR 09/29/2020: cardiac PET/CT stress Severe multivessel calcium EF 53% rest/stress 64%, normal perfusion at rest, reversible defect stress mid sepum, small , no transient dilation, summed difference score was one Overall low risk 09/13/21: CTI ablation 2015 cath with NOD  Patient Profile     61 y.o. male NICM (in setting of ETOH abuse that has recovered), VHD (s/p MV ring 2016), non-obstructive CAD, AFib (described as persistent w/hx of PVI ablation), Aflutter (s/p CTI ablation), ICD ([employer], 2016), HTN,  hx of elevated LFTs in the past, but not recently. Hep C Ab +ve, RNA undetectable.  Sought attention after a shock from his ICD, found in an AFlutter and shock inappropriate for RVR. Admitted and started on amiodarone given 2nd event of RVR and hospital visit in only a few days  Assessment & Plan    Inappropriate ICD shock (by record) Atypical Aflutter Persistent AFib CHA2DS2Vasc is 3 (Including some degree of non-obstructive CAD), on Eliquis Remains out of rhythm despite ICD shock Amiodarone gtt started   He has been on amiodarone in the past and by notes stopped post ablation felt no longer needed. He is young, has some hx of abn LFTs (though not persistently) with hx of Hep C (treated), may not be the best agent long term again for him. Though currently being utilized at a bridge to perhaps ablative therapy Dr. Quentin Ore will see him today  DCCV in the ER 05/28/22 > resumed Eliquis that day and has not missed any doses since I have him scheduled for DCCV tomorrow on AAD  He has been shocked a number of times by his device in the past, he thinks, but not certain, that was all for AFib and none appropriate. Before we make any programming changes to  his zone, I have asked [employer] rep to see if they can pull out his historical data to see if he has had any appropriate  therapies in the past     VHD Hx of MV repair (ring) Stable by his echo   NICM by hx  E down some again, perhaps 2/2 RVR Not volume OL On BB/ACE Suspect this will turn around with rhythm management   For questions or updates, please contact [place] Please consult [email or web address] for contact info under        Signed, Baldwin Jamaica, PA-C  06/04/2022, 7:52 AM

## 2022-06-04 NOTE — Progress Notes (Signed)
Heart Failure Stewardship Pharmacist Progress Note   PCP: Pcp, No PCP-Cardiologist: None    HPI:  61 y.o. male with history of mitral valve repair, cardiomyopathy (remote EF 35%, most recent EF 50-55% and trace mitral insufficiency by echo in September 2021, now back down to 40-45% 06/03/2022), ICD placement 09/2015, and recurrent atrial tachyarrhythmia  who presented on 06/02/2022 after defibrillator discharge for atrial flutter with 1:1 AV conduction.  He experience sudden onset of weakness and near syncope after which he received a defibrillator shock.  He "usually feels better after the shock", but this time he does not feel that he has improved.  He has had inappropriate defibrillator shocks for atrial arrhythmia before, in September 2022 (13 consecutive shocks). Defibrillator interrogation with 3 episodes of nonsustained Afl that resolved spontaneously followed by a sustained episode at 226 bpm placing him in the detection zone for VF (> 222 bpm). With shock he converted to NSR, but went back into Afl in 2:1 AV conduction.   He is scheduled for DCCV 06/05/2022. EP will evaluate for ablative therapy.   He was also admitted on 05/28/2022 with atrial flutter with rapid ventricular response that has been going on for about 24 hours and underwent semielective cardioversion.  He is due to see Dr. Caryl Comes for his first cardiology appointment since moving to [facility] on 06/10/2022.  He is not yet enrolled in the [facility] heart care device clinic.   Current HF Medications: Beta Blocker: carvedilol 25 mg BID ACE/ARB/ARNI: lisinopril 10 mg daily Aldosterone Antagonist: none SGLT2i: none  Prior to admission HF Medications: Beta blocker: carvedilol 25 mg BID ACE/ARB/ARNI: lisinopril 10 mg daily  Pertinent Lab Values: Serum creatinine 1.17, BUN 19, Potassium 3.9, Sodium 136, BNP 105, Magnesium 2.1, A1c 5.9%   Vital Signs: Weight: 219 lbs (admission weight: 219 lbs) Blood pressure: 127/87  Heart  rate: 107 (AF) I/O: not documented, evolemic, no diuretic needed  Medication Assistance / Insurance Benefits Check: Does the patient have prescription insurance?  Yes Type of insurance plan: Film/video editor  Outpatient Pharmacy:  Prior to admission outpatient pharmacy: [facility] Is the patient willing to use [facility] at discharge? Yes Is the patient willing to transition their outpatient pharmacy to utilize a [facility] outpatient pharmacy?   Pending  Assessment: 1. chronic systolic CHF (LVEF [id number] 06/03/2022), due to NICM. NYHA class I symptoms. -Patient with planned DCCV 06/05/2022.  - Continue carvedilol 25 mg BID and amiodarone gtt until after DCCV and change in HR is seen. - BP controlled on lisinopril 10 mg daily PTA. Change to equipotent valsartan 80 mg daily as bridge to possible Entresto.  - K currently 3.9, start spironolactone 12.5 mg daily to help keep K >4 - Patient with preDM based on A1c and SGLT2i indicated regardless of EF.  - Keep K >4, Mg > 2  Plan: 1) Medication changes recommended at this time: -Recommend starting spironolactone 12.5 mg daily -Recommend starting Farxiga 10 mg daily -Recommend changing lisinopril 10 mg daily to valsartan 80 mg daily -Recommend continuing carvedilol 25 mg BID  2) Patient assistance: -patient has $2,500 deductible but can use copay cards for brand name medications -copay cards given to patient 06/04/2022 for Eliquis, Delene Loll, and Farxiga  3)  Education  - To be completed prior to discharge  Cathrine Muster, PharmD, [facility] PGY2 Cardiology Pharmacy Resident

## 2022-06-05 ENCOUNTER — Encounter (HOSPITAL_COMMUNITY): Payer: Self-pay | Admitting: Cardiovascular Disease

## 2022-06-05 ENCOUNTER — Encounter (HOSPITAL_COMMUNITY)
Admission: EM | Disposition: A | Discharge status: Home / Self Care | Payer: Self-pay | Source: Home / Self Care | Attending: Cardiovascular Disease

## 2022-06-05 ENCOUNTER — Encounter (HOSPITAL_COMMUNITY): Payer: Self-pay | Admitting: General Practice

## 2022-06-05 LAB — BASIC METABOLIC PANEL
Anion gap: 10 (ref 5–15)
BUN: 18 mg/dL (ref 8–23)
CO2: 22 mmol/L (ref 22–32)
Calcium: 9.1 mg/dL (ref 8.9–10.3)
Chloride: 106 mmol/L (ref 98–111)
Creatinine, Ser: 1.04 mg/dL (ref 0.61–1.24)
GFR, Estimated: >60 mL/min (ref >60)
Glucose, Bld: 167 mg/dL — ABNORMAL HIGH (ref 70–99)
Potassium: 3.8 mmol/L (ref 3.5–5.1)
Sodium: 138 mmol/L (ref 135–145)

## 2022-06-05 LAB — SURGICAL PCR SCREEN
MRSA, PCR: NEGATIVE
Staphylococcus aureus: NEGATIVE

## 2022-06-05 LAB — MAGNESIUM: Magnesium: 2.2 mg/dL (ref 1.7–2.4)

## 2022-06-05 SURGERY — CANCELLED PROCEDURE

## 2022-06-05 MED ORDER — MUPIROCIN 2 % EX OINT
1.0000 "application " | TOPICAL_OINTMENT | Freq: Two times a day (BID) | CUTANEOUS | Status: DC
  Administered 2022-06-05 (×2): 1 via NASAL
  Filled 2022-06-05 (×2): qty 22

## 2022-06-05 MED ORDER — AMIODARONE HCL 200 MG PO TABS
400.0000 mg | ORAL_TABLET | Freq: Every day | ORAL | Status: DC
  Administered 2022-06-05 – 2022-06-06 (×2): 400 mg via ORAL
  Filled 2022-06-05 (×2): qty 2

## 2022-06-05 NOTE — Progress Notes (Signed)
Heart Failure Nurse Navigator Progress Note  PCP: Pcp, No PCP-Cardiologist: none Admission Diagnosis: Atypical flutter, AICD discharge, abnormal ECG Admitted from: Home  Presentation:   Corey Perkins presented with chest pain , reports his ICD fired, feeling dizzy when walking around doing yard work,Was in [place] recently on 05/28/22 for the same thing. BP 164/102, HR 113, BNP 105.7, Amiodarone drip started, EKG showed Atypical flutter.TTE completed on 06/03/22, Plan for DCCV 06/05/22.   Patient and wife Corey Perkins were educated on the sign and symptoms of heart failure, daily weights, diet and fluid restrictions, when to call his doctor or go to the ER. Importance of taking all medications as prescribed and attending all medical appointments. Patient and wife were very interactive in the education and asked a number of questions. Voiced their understanding, patient is schedule to HF TOC on 06/18/22 @ 9 am.   ECHO/ LVEF: 40-45%   Clinical Course:  Past Medical History:  Diagnosis Date   Diabetes mellitus without complication ([place])    Hepatitis C      Social History   Socioeconomic History   Marital status: Married    Spouse name: Corey Perkins   Number of children: 2   Years of education: Not on file   Highest education level: High school graduate  Occupational History    Comment: currently not working,  Tobacco Use   Smoking status: Former    Years: 20.00    Types: Cigarettes    Quit date: 09/12/2021    Years since quitting: 0.7   Smokeless tobacco: Never  Vaping Use   Vaping Use: Never used  Substance and Sexual Activity   Alcohol use: Not Currently    Comment: quit in 08/2021   Drug use: Yes    Types: Marijuana    Comment: years ago   Sexual activity: Not on file  Other Topics Concern   Not on file  Social History Narrative   Not on file   Social Determinants of Health   Financial Resource Strain: Not on file  Food Insecurity: No Food Insecurity   Worried About Sales executive in the Last Year: Never true   [place] in the Last Year: Never true  Transportation Needs: No Transportation Needs   Lack of Transportation (Medical): No   Lack of Transportation (Non-Medical): No  Physical Activity: Not on file  Stress: Not on file  Social Connections: Not on file   Education Assessment and Provision:  Detailed education and instructions provided on heart failure disease management including the following:  Signs and symptoms of Heart Failure When to call the physician Importance of daily weights Low sodium diet Fluid restriction Medication management Anticipated future follow-up appointments  Patient education given on each of the above topics.  Patient acknowledges understanding via teach back method and acceptance of all instructions.  Education Materials:  "Living Better With Heart Failure" Booklet, HF zone tool, & Daily Weight Tracker Tool.  Patient has scale at home: yes Patient has pill box at home: NA    High Risk Criteria for Readmission and/or Poor Patient Outcomes: Heart failure hospital admissions (last 6 months): 0  No Show rate: 0 Difficult social situation: No Demonstrates medication adherence: yes Primary Language: English Literacy level: reading, writing, and comprehension.   Barriers of Care:   Diet/ fluid restrictions ( eating out a lot d't remodel of house) Daily weights  Considerations/Referrals:   Referral made to Heart Failure Pharmacist Stewardship: yes Referral made to Heart  Failure CSW/NCM TOC: Yes, needs PCP and Cardiologist Referral made to Heart & Vascular TOC clinic: yes, 06/18/22 @ 9 am..   Items for Follow-up on DC/TOC: New medications Diet/fluid ( eating out a lot with house remodel) Daily weights    Earnestine Leys, BSN, RN Heart Failure Leisure centre manager Chat Only

## 2022-06-05 NOTE — Progress Notes (Signed)
Heart Failure Stewardship Pharmacist Progress Note   PCP: Pcp, No PCP-Cardiologist: None    HPI:  61 y.o. male with history of mitral valve repair, cardiomyopathy (remote EF 35%, most recent EF 50-55% and trace mitral insufficiency by echo in September 2021, now back down to 40-45% 06/03/2022), ICD placement 09/2015, and recurrent atrial tachyarrhythmia  who presented on 06/02/2022 after defibrillator discharge for atrial flutter with 1:1 AV conduction.  He experience sudden onset of weakness and near syncope after which he received a defibrillator shock.  He "usually feels better after the shock", but this time he does not feel that he has improved.  He has had inappropriate defibrillator shocks for atrial arrhythmia before, in September 2022 (13 consecutive shocks). Defibrillator interrogation with 3 episodes of nonsustained Afl that resolved spontaneously followed by a sustained episode at 226 bpm placing him in the detection zone for VF (> 222 bpm). With shock he converted to NSR, but went back into Afl in 2:1 AV conduction.   He is scheduled for DCCV 06/05/2022 however was able to be converted with overdrive atrial pacing. Converted to NSR at 60 bpm. EP will evaluate for ablative therapy with plans as outpatient. Plan for discharge 06/06/2022.   He was also admitted on 05/28/2022 with atrial flutter with rapid ventricular response that has been going on for about 24 hours and underwent semielective cardioversion.  He is due to see Dr. Graciela Husbands for his first cardiology appointment since moving to [facility] on 06/10/2022.  He is not yet enrolled in the [facility] heart care device clinic.   Current HF Medications: Beta Blocker: carvedilol 25 mg BID ACE/ARB/ARNI: lisinopril 10 mg daily Aldosterone Antagonist: none SGLT2i: none  Prior to admission HF Medications: Beta blocker: carvedilol 25 mg BID ACE/ARB/ARNI: lisinopril 10 mg daily  Pertinent Lab Values: Serum creatinine 1.04, BUN 18, Potassium  3.8, Sodium 136, BNP 105, Magnesium 2.2, A1c 5.9%   Vital Signs: Weight: 219 lbs (admission weight: 219 lbs) Blood pressure: 127/87  Heart rate: 60  I/O: not documented, evolemic, no diuretic needed  Medication Assistance / Insurance Benefits Check: Does the patient have prescription insurance?  Yes Type of insurance plan: Financial risk analyst  Outpatient Pharmacy:  [facility] Is the patient willing to use [facility] TOC pharmacy at discharge? Yes Is the patient willing to transition their outpatient pharmacy to utilize a [facility] outpatient pharmacy?   Pending  Assessment: 1. chronic systolic CHF (LVEF 40-45% 06/03/2022), due to NICM. NYHA class I symptoms. -Patient with planned DCCV 06/05/2022.  - HR 60 after return to NSR. Continue carvedilol 25 mg BID and convert amiodarone gtt to PO.  - BP controlled on lisinopril 10 mg daily PTA. Change to equipotent valsartan 80 mg daily as bridge to possible Entresto.  - K currently 3.8, start spironolactone 12.5 mg daily to help keep K >4 - Patient with preDM based on A1c and SGLT2i indicated regardless of EF.  - Keep K >4, Mg > 2  Plan: 1) Medication changes recommended at this time: -Recommend starting spironolactone 12.5 mg daily -Recommend starting Farxiga 10 mg daily -Recommend changing lisinopril 10 mg daily to valsartan 80 mg daily -Recommend continuing carvedilol 25 mg BID  2) Patient assistance: -patient has $2,500 deductible but can use copay cards for brand name medications -copay cards given to patient 06/04/2022 for Eliquis, Entresto, and Farxiga  3)  Education  - Patient has been educated on current HF medications and potential additions to HF medication  regimen - Patient verbalizes understanding that over the next few months, these medication doses may change and more medications may be added to optimize HF regimen - Patient has been educated on basic disease state pathophysiology and  goals of therapy  Drake Leach, PharmD, BCPS PGY2 Cardiology Pharmacy Resident

## 2022-06-05 NOTE — Plan of Care (Signed)
Problem: Education: Goal: Knowledge of General Education information will improve Description: Including pain rating scale, medication(s)/side effects and non-pharmacologic comfort measures 06/05/2022 1605 by Mamie Nick I, RN Outcome: Progressing 06/05/2022 1604 by Mamie Nick I, RN Outcome: Progressing   Problem: Health Behavior/Discharge Planning: Goal: Ability to manage health-related needs will improve 06/05/2022 1605 by Mamie Nick I, RN Outcome: Progressing 06/05/2022 1604 by Mamie Nick I, RN Outcome: Progressing   Problem: Clinical Measurements: Goal: Ability to maintain clinical measurements within normal limits will improve 06/05/2022 1605 by Mamie Nick I, RN Outcome: Progressing 06/05/2022 1604 by Mamie Nick I, RN Outcome: Progressing Goal: Will remain free from infection 06/05/2022 1605 by Mamie Nick I, RN Outcome: Progressing 06/05/2022 1604 by Mamie Nick I, RN Outcome: Progressing Goal: Diagnostic test results will improve 06/05/2022 1605 by Mamie Nick I, RN Outcome: Progressing 06/05/2022 1604 by Mamie Nick I, RN Outcome: Progressing Goal: Respiratory complications will improve 06/05/2022 1605 by Mamie Nick I, RN Outcome: Progressing 06/05/2022 1604 by Mamie Nick I, RN Outcome: Progressing Goal: Cardiovascular complication will be avoided 06/05/2022 1605 by Mamie Nick I, RN Outcome: Progressing 06/05/2022 1604 by Mamie Nick I, RN Outcome: Progressing   Problem: Activity: Goal: Risk for activity intolerance will decrease 06/05/2022 1605 by Mamie Nick I, RN Outcome: Progressing 06/05/2022 1604 by Mamie Nick I, RN Outcome: Progressing   Problem: Nutrition: Goal: Adequate nutrition will be maintained 06/05/2022 1605 by Mamie Nick I, RN Outcome: Progressing 06/05/2022 1604 by Mamie Nick I, RN Outcome: Progressing   Problem: Coping: Goal: Level of anxiety will  decrease 06/05/2022 1605 by Mamie Nick I, RN Outcome: Progressing 06/05/2022 1604 by Mamie Nick I, RN Outcome: Progressing   Problem: Elimination: Goal: Will not experience complications related to bowel motility 06/05/2022 1605 by Mamie Nick I, RN Outcome: Progressing 06/05/2022 1604 by Mamie Nick I, RN Outcome: Progressing Goal: Will not experience complications related to urinary retention 06/05/2022 1605 by Mamie Nick I, RN Outcome: Progressing 06/05/2022 1604 by Mamie Nick I, RN Outcome: Progressing   Problem: Pain Managment: Goal: General experience of comfort will improve 06/05/2022 1605 by Mamie Nick I, RN Outcome: Progressing 06/05/2022 1604 by Mamie Nick I, RN Outcome: Progressing   Problem: Safety: Goal: Ability to remain free from injury will improve 06/05/2022 1605 by Mamie Nick I, RN Outcome: Progressing 06/05/2022 1604 by Mamie Nick I, RN Outcome: Progressing   Problem: Skin Integrity: Goal: Risk for impaired skin integrity will decrease 06/05/2022 1605 by Mamie Nick I, RN Outcome: Progressing 06/05/2022 1604 by Mamie Nick I, RN Outcome: Progressing   Problem: Education: Goal: Knowledge of disease or condition will improve 06/05/2022 1605 by Mamie Nick I, RN Outcome: Progressing 06/05/2022 1604 by Mamie Nick I, RN Outcome: Progressing Goal: Understanding of medication regimen will improve 06/05/2022 1605 by Mamie Nick I, RN Outcome: Progressing 06/05/2022 1604 by Mamie Nick I, RN Outcome: Progressing Goal: Individualized Educational Video(s) 06/05/2022 1605 by Mamie Nick I, RN Outcome: Progressing 06/05/2022 1604 by Mamie Nick I, RN Outcome: Progressing   Problem: Activity: Goal: Ability to tolerate increased activity will improve 06/05/2022 1605 by Mamie Nick I, RN Outcome: Progressing 06/05/2022 1604 by Mamie Nick I, RN Outcome:  Progressing   Problem: Cardiac: Goal: Ability to achieve and maintain adequate cardiopulmonary perfusion will improve 06/05/2022 1605 by Mamie Nick I, RN Outcome: Progressing 06/05/2022 1604 by Mamie Nick I, RN Outcome: Progressing   Problem: Health Behavior/Discharge Planning: Goal: Ability to safely manage health-related needs after discharge will improve 06/05/2022 1605 by Mamie Nick I,  RN Outcome: Progressing 06/05/2022 1604 by Mamie Nick I, RN Outcome: Progressing

## 2022-06-05 NOTE — Op Note (Signed)
Presented for DC cardioversion for atypical atrial flutter. Before proceeding to cardioversion, overdrive atrial pacing was attempted. Flutter CL was approximately 240 ms. Atrial burst pacing was unsuccessful at 230-220-220-200 ms cyscle lenths, but worked at 190 ms, converting to Apaced-Vsensed rhythm at 60 bpm. AF suppression was turned "on" at nominal settings, max rate 95 bpm. Cardioversion was canceled.

## 2022-06-05 NOTE — Progress Notes (Signed)
Case canceled , patient converted by dr with pacemaker equipment

## 2022-06-05 NOTE — Interval H&P Note (Signed)
History and Physical Interval Note:  06/05/2022 8:48 AM  Corey Perkins  has presented today for surgery, with the diagnosis of aflutter.  The various methods of treatment have been discussed with the patient and family. After consideration of risks, benefits and other options for treatment, the patient has consented to  Procedure(s): CARDIOVERSION (N/A) as a surgical intervention.  The patient's history has been reviewed, patient examined, no change in status, stable for surgery.  I have reviewed the patient's chart and labs.  Questions were answered to the patient's satisfaction.     [name]

## 2022-06-05 NOTE — Progress Notes (Signed)
Progress Note  Patient Name: Corey Perkins Date of Encounter: 06/05/2022  [facility] Cardiologist: new to [facility] (and [facility])  Subjective   Doing OK, no CP, no SOB  Inpatient Medications    Scheduled Meds:  apixaban  5 mg Oral BID   carvedilol  25 mg Oral BID   lisinopril  10 mg Oral Daily   metFORMIN  1,000 mg Oral BID   mupirocin ointment  1 application. Nasal BID   Continuous Infusions:  amiodarone 30 mg/hr (06/05/22 0142)   PRN Meds: acetaminophen, ondansetron (ZOFRAN) IV   Vital Signs    Vitals:   06/04/22 1300 06/04/22 2000 06/04/22 2013 06/05/22 0522  BP: 128/81  (!) 146/92 126/84  Pulse: (!) 108  (!) 109 100  Resp: 15  17 17   Temp: 98.6 F (37 C)  98.3 F (36.8 C) 97.9 F (36.6 C)  TempSrc: Oral  Oral Oral  SpO2: 99% 98% 97% 99%  Weight:      Height:       No intake or output data in the 24 hours ending 06/05/22 0859     06/02/2022    8:00 PM 05/28/2022    4:22 PM  Last 3 Weights  Weight (lbs) 219 lb 12.8 oz 220 lb  Weight (kg) 99.7 kg 99.791 kg      Telemetry    Remains in AFlutter, 100's - Personally Reviewed  ECG    No new EKGs - Personally Reviewed  Physical Exam   Exam is unchanged GEN: No acute distress.   Neck: No JVD Cardiac: RRR, tachycardic, no murmurs, rubs, or gallops.  Respiratory: CTA b/l. GI: Soft, nontender, non-distended  MS: No edema; No deformity. Neuro:  Nonfocal  Psych: Normal affect   Labs    High Sensitivity Troponin:   Recent Labs  Lab 05/28/22 1938 05/28/22 2059 06/02/22 1614 06/02/22 1854  TROPONINIHS 7 9 11 17      Chemistry Recent Labs  Lab 06/02/22 1614 06/02/22 1854 06/04/22 0403 06/05/22 0259  NA 133*  --  136 138  K 4.0  --  3.9 3.8  CL 102  --  107 106  CO2 24  --  26 22  GLUCOSE 175*  --  136* 167*  BUN 16  --  19 18  CREATININE 0.97  --  1.17 1.04  CALCIUM 9.1  --  8.8* 9.1  MG  --  2.0 2.1 2.2  PROT  --  7.5  --   --   ALBUMIN  --  3.7  --   --   AST  --  18  --   --    ALT  --  25  --   --   ALKPHOS  --  61  --   --   BILITOT  --  0.7  --   --   GFRNONAA >60  --  >60 >60  ANIONGAP 7  --  3* 10    Lipids  Recent Labs  Lab 06/04/22 0403  CHOL 191  TRIG 187*  HDL 36*  LDLCALC 118*  CHOLHDL 5.3    Hematology Recent Labs  Lab 06/02/22 1614  WBC 8.2  RBC 4.19*  HGB 13.6  HCT 38.8*  MCV 92.6  MCH 32.5  MCHC 35.1  RDW 12.3  PLT 110*   Thyroid  Recent Labs  Lab 06/02/22 1854  TSH 3.096  FREET4 0.83    BNP Recent Labs  Lab 06/02/22 1614  BNP 105.7*  DDimer  No results for input(s): DDIMER in the last 168 hours.    Radiology     Cardiac Studies   06/03/22: TTE 1. Left ventricular ejection fraction, by estimation, is 40 to 45%. The  left ventricle has mildly decreased function. The left ventricle  demonstrates global hypokinesis. There is mild concentric left ventricular  hypertrophy. Indeterminate diastolic  filling due to E-A fusion.   2. Right ventricular systolic function is mildly reduced. The right  ventricular size is normal.   3. The mitral valve has been repaired/replaced. No evidence of mitral  valve regurgitation. No evidence of mitral stenosis. There is a prosthetic  annuloplasty ring present in the mitral position.   4. Tricuspid valve regurgitation is mild to moderate.   5. The aortic valve is tricuspid. Aortic valve regurgitation is not  visualized. Aortic valve sclerosis is present, with no evidence of aortic  valve stenosis.   6. There is borderline dilatation of the aortic root, measuring 37 mm.  There is mild dilatation of the ascending aorta, measuring 38 mm.   7. The inferior vena cava is normal in size with greater than 50%  respiratory variability, suggesting right atrial pressure of 3 mmHg.   Comparison(s): No prior Echocardiogram.    09/12/2020: TTE, LVEF 50-55%, mod conc LVH, trace AI, trace MR 09/29/2020: cardiac PET/CT stress Severe multivessel calcium EF 53% rest/stress 64%, normal  perfusion at rest, reversible defect stress mid sepum, small , no transient dilation, summed difference score was one Overall low risk 09/13/21: CTI ablation 2015 cath with NOD  Patient Profile     61 y.o. male NICM (in setting of ETOH abuse that has recovered), VHD (s/p MV ring 2016), non-obstructive CAD, AFib (described as persistent w/hx of PVI ablation), Aflutter (s/p CTI ablation), ICD ([employer], 2016), HTN,  hx of elevated LFTs in the past, but not recently. Hep C Ab +ve, RNA undetectable.  Sought attention after a shock from his ICD, found in an AFlutter and shock inappropriate for RVR. Admitted and started on amiodarone given 2nd event of RVR and hospital visit in only a few days  Assessment & Plan    Inappropriate ICD shock (by record) Atypical Aflutter Persistent AFib CHA2DS2Vasc is 3 (Including some degree of non-obstructive CAD), on Eliquis Remains out of rhythm despite ICD shock Amiodarone gtt started   He has been on amiodarone in the past and by notes stopped post ablation felt no longer needed. He is young, has some hx of abn LFTs (though not persistently) with hx of Hep C (treated), may not be the best agent long term again for him. Though currently being utilized at a bridge to perhaps ablative therapy Dr. Quentin Ore will see him today  DCCV in the ER 05/28/22 > resumed Eliquis that day and has not missed any doses since DCCV today now on amiodarone plan for PO post DCCV Out patient follow up and plans for ablation  He has been shocked a number of times by his device in the past, he thinks, but not certain, that was all for AFib and none appropriate. Before we make any programming changes to his zone, [employer] rep was unable to find any historical device therapies   anticipate d/c tomorrow   VHD Hx of MV repair (ring) Stable by his echo   NICM by hx  E down some again, perhaps 2/2 RVR Not volume OL On BB/ACE Suspect this will turn around with rhythm  management   For questions or updates,  please contact [place] Please consult [email or web address] for contact info under        Signed, Baldwin Jamaica, PA-C  06/05/2022, 8:59 AM

## 2022-06-05 NOTE — Plan of Care (Signed)

## 2022-06-06 ENCOUNTER — Other Ambulatory Visit (HOSPITAL_COMMUNITY): Payer: Self-pay

## 2022-06-06 LAB — BASIC METABOLIC PANEL
Anion gap: 6 (ref 5–15)
BUN: 23 mg/dL (ref 8–23)
CO2: 23 mmol/L (ref 22–32)
Calcium: 9.1 mg/dL (ref 8.9–10.3)
Chloride: 107 mmol/L (ref 98–111)
Creatinine, Ser: 1.12 mg/dL (ref 0.61–1.24)
GFR, Estimated: >60 mL/min (ref >60)
Glucose, Bld: 154 mg/dL — ABNORMAL HIGH (ref 70–99)
Potassium: 4 mmol/L (ref 3.5–5.1)
Sodium: 136 mmol/L (ref 135–145)

## 2022-06-06 MED ORDER — AMIODARONE HCL 200 MG PO TABS
200.0000 mg | ORAL_TABLET | Freq: Every day | ORAL | 6 refills | Status: DC
  Filled 2022-06-06: qty 30, 30d supply, fill #0

## 2022-06-06 NOTE — Progress Notes (Signed)
Heart Failure Stewardship Pharmacist Progress Note   PCP: Pcp, No PCP-Cardiologist: None    HPI:  61 y.o. male with history of mitral valve repair, cardiomyopathy (remote EF 35%, most recent EF 50-55% and trace mitral insufficiency by echo in September 2021, now back down to 40-45% 06/03/2022), ICD placement 09/2015, and recurrent atrial tachyarrhythmia  who presented on 06/02/2022 after defibrillator discharge for atrial flutter with 1:1 AV conduction.  He experience sudden onset of weakness and near syncope after which he received a defibrillator shock.  He "usually feels better after the shock", but this time he does not feel that he has improved.  He has had inappropriate defibrillator shocks for atrial arrhythmia before, in September 2022 (13 consecutive shocks). Defibrillator interrogation with 3 episodes of nonsustained Afl that resolved spontaneously followed by a sustained episode at 226 bpm placing him in the detection zone for VF (> 222 bpm). With shock he converted to NSR, but went back into Afl in 2:1 AV conduction.   He is scheduled for DCCV 06/05/2022 however was able to be converted with overdrive atrial pacing. Converted to NSR at 60 bpm. Patient is now back in AF. EP will evaluate for ablative therapy with plans as outpatient. Plan for discharge 06/06/2022.   He was also admitted on 05/28/2022 with atrial flutter with rapid ventricular response that has been going on for about 24 hours and underwent semielective cardioversion.  He is due to see Dr. Caryl Comes for his first cardiology appointment since moving to [facility] on 06/10/2022. He is not yet enrolled in the [facility] heart care device clinic.   Current HF Medications: Beta Blocker: carvedilol 25 mg BID ACE/ARB/ARNI: lisinopril 10 mg daily Aldosterone Antagonist: none SGLT2i: none  Prior to admission HF Medications: Beta blocker: carvedilol 25 mg BID ACE/ARB/ARNI: lisinopril 10 mg daily  Pertinent Lab Values: Serum creatinine  1.12, BUN 23, Potassium 4.0, Sodium 136, BNP 105, Magnesium 2.2, A1c 5.9%   Vital Signs: Weight: 219 lbs (admission weight: 219 lbs) Blood pressure: 135-140/70 Heart rate: 60 (AF) I/O: not documented, evolemic, no diuretic needed  Medication Assistance / Insurance Benefits Check: Does the patient have prescription insurance?  Yes Type of insurance plan: [employer]  Outpatient Pharmacy:  Prior to admission outpatient pharmacy: [facility] Is the patient willing to use [facility] at discharge? Yes Is the patient willing to transition their outpatient pharmacy to utilize a [facility] outpatient pharmacy?   Pending  Assessment: 1. chronic systolic CHF (LVEF [id number] 06/03/2022), due to NICM. NYHA class I symptoms. -Patient with planned DCCV 06/05/2022. HR 60 after return to NSR. Now back in AF. Continue carvedilol 25 mg BID and amiodarone. EP to eval for ablation as outpatient.  - BP controlled on lisinopril 10 mg daily PTA. Change to equipotent valsartan 80 mg daily as bridge to possible Entresto.  - K currently 4.0, start spironolactone 12.5 mg daily to help keep K >4 - Patient with preDM based on A1c and SGLT2i indicated regardless of EF.  - Keep K >4, Mg > 2  Plan: 1) Medication changes recommended at this time: -Recommend starting spironolactone 12.5 mg daily -Recommend starting Farxiga 10 mg daily -Recommend changing lisinopril 10 mg daily to valsartan 80 mg daily  2) Patient assistance: -patient has $2,500 deductible but can use copay cards for brand name medications -copay cards given to patient 06/04/2022 for Eliquis, Entresto, and Farxiga  3)  Education  - Patient has been educated on current HF medications and potential additions  to HF medication regimen - Patient verbalizes understanding that over the next few months, these medication doses may change and more medications may be added to optimize HF regimen - Patient has been educated on basic disease state  pathophysiology and goals of therapy  Cathrine Muster, PharmD, [place] PGY2 Cardiology Pharmacy Resident

## 2022-06-06 NOTE — Progress Notes (Addendum)
Heart Failure Navigation Team Progress Note  PCP: Pcp, No Primary Cardiologist: N/A Admitted from: home  Past Medical History:  Diagnosis Date   Diabetes mellitus without complication (HCC)    Hepatitis C     Social History   Socioeconomic History   Marital status: Married    Spouse name: [name]   Number of children: 2   Years of education: Not on file   Highest education level: High school graduate  Occupational History    Comment: currently not working,  Tobacco Use   Smoking status: Former    Years: 20.00    Types: Cigarettes    Quit date: 09/12/2021    Years since quitting: 0.7   Smokeless tobacco: Never  Vaping Use   Vaping Use: Never used  Substance and Sexual Activity   Alcohol use: Not Currently    Comment: quit in 08/2021   Drug use: Yes    Types: Marijuana    Comment: years ago   Sexual activity: Not on file  Other Topics Concern   Not on file  Social History Narrative   Not on file   Social Determinants of Health   Financial Resource Strain: Not on file  Food Insecurity: No Food Insecurity (06/05/2022)   Hunger Vital Sign    Worried About Running Out of Food in the Last Year: Never true    Ran Out of Food in the Last Year: Never true  Transportation Needs: No Transportation Needs (06/05/2022)   PRAPARE - Administrator, Civil Service (Medical): No    Lack of Transportation (Non-Medical): No  Physical Activity: Not on file  Stress: Not on file  Social Connections: Not on file     Heart & Vascular Transition of Care Clinic follow-up: Scheduled for 06/18/22.  Confirmed transportation.  Immediate social needs: PCP and Cardiology provider  HF CSW received a consult from the HF RN navigator to help with a PCP and cardiology follow up appointment. HF CSW scheduled an appointment at [facility] for their soonest available Monday July 08, 2022 at 2:30pm with [name]. Awaiting confirmation back about the cards appointment before scheduling.  Appointment information can be found on the patients AVS.  Corey Perkins, MSW, LCSW [phone] Heart Failure Social Worker

## 2022-06-06 NOTE — Discharge Summary (Signed)
ELECTROPHYSIOLOGY PROCEDURE DISCHARGE SUMMARY    Patient ID: Corey Perkins,  MRN: [id number], DOB/AGE: 1961/10/18 61 y.o.  Admit date: 06/02/2022 Discharge date: 06/06/2022  Primary Care Physician: Pcp, No  Primary Cardiologist/Electrophysiologist: new to [facility]  Primary Discharge Diagnosis:  ICD shock (inappropriate) AFlutter Persistent AFib  Secondary Discharge Diagnosis:  NICM VHD Hx of MV repair/ring HTN Hep C (treated)  Allergies  Allergen Reactions   Heparin Hives     Procedures This Admission:  Pace-termination of his AFlutter by Dr. Sallyanne Kuster 06/05/22  Brief HPI: Corey Perkins is a 61 y.o. male w/PMHx including above, recently moved from [place], not yet associate with cardiology care here, sought attention after his ICD shocked him.  Hospital Course:  The patient  had an ER visit 05/28/22 with tachycardia of about 24 hours associated with mild SOB, suspected he was in AFib, EKG was c/w AFlutter, given a bolus of diltiazem and  ultimately underwent DCCV in the ER, seems he had been off his Eliquis of late with some hemoptysis, though instructed to resume and discharged from the ER   He returns 06/02/22 after being shocked by his device.  He was seen by Dr. Sallyanne Kuster his note reported device interrogation Interrogation of his defibrillator shows that he had 3 episodes of nonsustained atrial flutter with 1:1 AV conduction that resolved spontaneously around 1412-1414h this afternoon but then at 1415h he had a sustained episode at 226 bpm, placing him in the detection zone for ventricular fibrillation (greater than 222 bpm).  ATP during charge was unsuccessful and he received a single high-voltage shock with conversion to normal rhythm. However, about 45 minutes later he again converted to atrial flutter, thankfully this time with 2: 1 AV conduction, which is the rhythm in which he presents in to the emergency room with a ventricular rate of 113 bpm. He was started on amiodarone  gtt and was admitted for further management.  He was kept on amiodarone gtt to load in prior to attempts at rhythm control again.  TTE done this admission is reduced from his last echo in [place] was 50-55%, this admission to 40-45%.  While he does have hx of NICM his recurrent reduction now is suspected to be tachy-mediated  Previously mentioned concerns /observation of some ?gingival bleeding on [place] by the patient, has not been noted here.  Discussed importance of not stopping his medication  Amiodarone not felt a good long-term strategy, with plans to pursue ablation in the coming months for his atypical AFlutter , likely LA in etiology   He was planned for DCCV though Dr. Sallyanne Kuster able to pace terminate his AFlutter via his device and he was transitioned to PO amiodarone.    The patient feels well, denies any CP/SOB, he was examined by Dr. Quentin Ore and considered stable for discharge to home.    Physical Exam: Vitals:   06/05/22 1438 06/05/22 2000 06/06/22 0641 06/06/22 0812  BP: 111/72 (!) 134/59 135/71 139/69  Pulse: (!) 59 61 60   Resp: 16 16 17    Temp: 98.6 F (37 C) 98.4 F (36.9 C) 98.1 F (36.7 C)   TempSrc: Oral Oral Oral   SpO2: 99% 99% 98%   Weight:      Height:        GEN- The patient is well appearing, alert and oriented x 3 today.   HEENT: normocephalic, atraumatic; sclera clear, conjunctiva pink; hearing intact; oropharynx clear; neck supple, no JVP Lungs- CTA b/l, normal work of breathing.  No wheezes, rales, rhonchi Heart- RRR no murmurs, rubs or gallops, PMI not laterally displaced GI- soft, non-tender, non-distended Extremities- no clubbing, cyanosis, or edema MS- no significant deformity or atrophy Skin- warm and dry, no rash or lesion Psych- euthymic mood, full affect Neuro- no gross deficits   Labs:   Lab Results  Component Value Date   WBC 8.2 06/02/2022   HGB 13.6 06/02/2022   HCT 38.8 (L) 06/02/2022   MCV 92.6 06/02/2022   PLT 110 (L)  06/02/2022    Recent Labs  Lab 06/02/22 1854 06/04/22 0403 06/06/22 0147  NA  --    < > 136  K  --    < > 4.0  CL  --    < > 107  CO2  --    < > 23  BUN  --    < > 23  CREATININE  --    < > 1.12  CALCIUM  --    < > 9.1  PROT 7.5  --   --   BILITOT 0.7  --   --   ALKPHOS 61  --   --   ALT 25  --   --   AST 18  --   --   GLUCOSE  --    < > 154*   < > = values in this interval not displayed.    Discharge Medications:  Allergies as of 06/06/2022       Reactions   Heparin Hives        Medication List     TAKE these medications    amiodarone 200 MG tablet Commonly known as: PACERONE Take 1 tablet (200 mg total) by mouth daily. START by taking 2 pills (400mg ) once daily for the next 4 days then reduce to 1 pill once daily Start taking on: June 07, 2022   carvedilol 25 MG tablet Commonly known as: COREG Take 25 mg by mouth 2 (two) times daily.   Centrum Men Tabs Take 1 tablet by mouth daily.   Eliquis 5 MG Tabs tablet Generic drug: apixaban Take 5 mg by mouth 2 (two) times daily.   lisinopril 10 MG tablet Commonly known as: ZESTRIL Take 10 mg by mouth daily.   metFORMIN 1000 MG tablet Commonly known as: GLUCOPHAGE Take 1,000 mg by mouth 2 (two) times daily.        Disposition: Home  Discharge Instructions     Amb referral to AFIB Clinic   Complete by: As directed    Diet - low sodium heart healthy   Complete by: As directed    Increase activity slowly   Complete by: As directed        Follow-up Information     [facility]. Go in 12 day(s).   Specialty: Cardiology Why: Hospital follow up PLEASE bring a current medication list to appointment. FREE valet parking, Entrance C, off [address]. Contact information: [address] [id number] [place]        Gildardo Pounds, NP. Go in 32 day(s).   Specialty: Nurse Practitioner Why: Hospital Follow  up at [facility] for Monday July 08, 2022 at 2:30pm Contact information: [place] [address] [place] [address] [phone]         Vickie Epley, MD Follow up.   Specialties: Cardiology, Radiology Why: 06/27/22 @ 1:45PM Contact information: [place]  [phone]                 Duration of Discharge Encounter: Greater than 30 minutes including physician time.  Venetia Night, PA-C 06/06/2022 10:50 AM

## 2022-06-06 NOTE — Progress Notes (Signed)
Went over discharge paper work with patient and wife. Answered all questions, removed PIV, and all belongings at bedside. Waiting on TOC medications.

## 2022-06-10 ENCOUNTER — Encounter: Payer: Self-pay | Admitting: Internal Medicine

## 2022-06-10 DIAGNOSIS — I484 Atypical atrial flutter: Secondary | ICD-10-CM

## 2022-06-10 DIAGNOSIS — Z95810 Presence of automatic (implantable) cardiac defibrillator: Secondary | ICD-10-CM

## 2022-06-14 ENCOUNTER — Telehealth: Payer: Self-pay | Admitting: *Deleted

## 2022-06-14 DIAGNOSIS — I4891 Unspecified atrial fibrillation: Secondary | ICD-10-CM

## 2022-06-14 DIAGNOSIS — Z01818 Encounter for other preprocedural examination: Secondary | ICD-10-CM

## 2022-06-14 NOTE — Telephone Encounter (Signed)
Patient picked Afib ablation date Sept 29 and pre op lab date Sept 8.  Verbalized understanding and agreement.

## 2022-06-14 NOTE — Telephone Encounter (Signed)
Left message to call back  

## 2022-06-14 NOTE — Telephone Encounter (Signed)
-----   Message from Lanier Prude, MD sent at 06/04/2022  9:08 PM EDT ----- Elvina Sidle, can you reach out to Mr. Whittier after he leaves the hospital.  He will need a repeat A-fib ablation for his recurrent atypical atrial flutter.  When you reach out to him in a week or so, would favor getting him on the schedule for redo ablation and then scheduling him for an office visit sometime in the next few weeks to discuss the details of this ablation.  I do not want to delay scheduling of the ablation until his appointment with me for post Hospital follow-up. Thanks,  Sheria Lang

## 2022-06-14 NOTE — Telephone Encounter (Signed)
Patient is returning phone call.  °

## 2022-06-17 ENCOUNTER — Encounter: Payer: Self-pay | Admitting: *Deleted

## 2022-06-17 NOTE — Telephone Encounter (Signed)
Work up completed and printed for patient's upcoming office visit.

## 2022-06-17 NOTE — Progress Notes (Signed)
HEART & VASCULAR TRANSITION OF CARE CONSULT NOTE     Referring Physician: Dr Royann Shivers Primary Care: Primary Cardiologist: Dr Royann Shivers  HPI: Referred to clinic by Dr Royann Shivers for heart failure consultation.   Corey Perkins is a 61 year old with h/o MVR, HTN, Hep C, and atrial flutter.  Presented ER visit 05/28/22 with tachycardia of about 24 hours associated with mild SOB, suspected he was in AFib, EKG was c/w AFlutter, given a bolus of diltiazem and  ultimately underwent DCCV in the ER, seems he had been off his Eliquis of late with some hemoptysis, though instructed to resume and discharged from the ER.   Presented to [facility] 06/02/22 after ICD shock. Interrogation of his defibrillator showed 3 episodes of nonsustained atrial flutter with 1:1 AV conduction that resolved spontaneously around 1412-1414h this afternoon but then at 1415h he had a sustained episode at 226 bpm, placing him in the detection zone for ventricular fibrillation (greater than 222 bpm).  ATP during charge was unsuccessful and he received a single high-voltage shock with conversion to normal rhythm. Placed on amio drip. Pace-termination of his AFlutter by Dr. Royann Shivers 06/05/22. Amiodarone not felt a good long-term strategy, with plans to pursue ablation in the coming months for his atypical AFlutter. Echo EF 40-45% . Discharged to home 06/05/22 with addition of amio 400 mg twice a day x 4 days then 200 mg daily.  Overall feeling fine. Denies SOB/PND/Orthopnea. Appetite ok. No fever or chills. Weight at home  pounds. Taking all medications     Cardiac Testing  Echo 05/2022  1. Left ventricular ejection fraction, by estimation, is 40 to 45%. The  left ventricle has mildly decreased function. The left ventricle  demonstrates global hypokinesis. There is mild concentric left ventricular  hypertrophy. Indeterminate diastolic  filling due to E-A fusion.   2. Right ventricular systolic function is mildly reduced. The right  ventricular  size is normal.   3. The mitral valve has been repaired/replaced. No evidence of mitral  valve regurgitation. No evidence of mitral stenosis. There is a prosthetic  annuloplasty ring present in the mitral position.   4. Tricuspid valve regurgitation is mild to moderate.   5. The aortic valve is tricuspid. Aortic valve regurgitation is not  visualized. Aortic valve sclerosis is present, with no evidence of aortic  valve stenosis.   Review of Systems: [y] = yes, [ ]  = no   General: Weight gain [ ] ; Weight loss [ ] ; Anorexia [ ] ; Fatigue [ ] ; Fever [ ] ; Chills [ ] ; Weakness [ ]   Cardiac: Chest pain/pressure [ ] ; Resting SOB [ ] ; Exertional SOB [ ] ; Orthopnea [ ] ; Pedal Edema [ ] ; Palpitations [ ] ; Syncope [ ] ; Presyncope [ ] ; Paroxysmal nocturnal dyspnea[ ]   Pulmonary: Cough [ ] ; Wheezing[ ] ; Hemoptysis[ ] ; Sputum [ ] ; Snoring [ ]   GI: Vomiting[ ] ; Dysphagia[ ] ; Melena[ ] ; Hematochezia [ ] ; Heartburn[ ] ; Abdominal pain [ ] ; Constipation [ ] ; Diarrhea [ ] ; BRBPR [ ]   GU: Hematuria[ ] ; Dysuria [ ] ; Nocturia[ ]   Vascular: Pain in legs with walking [ ] ; Pain in feet with lying flat [ ] ; Non-healing sores [ ] ; Stroke [ ] ; TIA [ ] ; Slurred speech [ ] ;  Neuro: Headaches[ ] ; Vertigo[ ] ; Seizures[ ] ; Paresthesias[ ] ;Blurred vision [ ] ; Diplopia [ ] ; Vision changes [ ]   Ortho/Skin: Arthritis [ ] ; Joint pain [ ] ; Muscle pain [ ] ; Joint swelling [ ] ; Back Pain [ ] ; Rash [ ]   Psych: Depression[ ] ; Anxiety[ ]   Heme: Bleeding problems [ ] ; Clotting disorders [ ] ; Anemia [ ]   Endocrine: Diabetes [ ] ; Thyroid dysfunction[ ]    Past Medical History:  Diagnosis Date   Diabetes mellitus without complication (HCC)    Hepatitis C     Current Outpatient Medications  Medication Sig Dispense Refill   amiodarone (PACERONE) 200 MG tablet START by taking 2 tablets (400mg ) once daily for the next 4 days then reduce to 1 tablet once daily 30 tablet 6   carvedilol (COREG) 25 MG tablet Take 25 mg by mouth 2 (two) times  daily.     ELIQUIS 5 MG TABS tablet Take 5 mg by mouth 2 (two) times daily.     lisinopril (ZESTRIL) 10 MG tablet Take 10 mg by mouth daily.     metFORMIN (GLUCOPHAGE) 1000 MG tablet Take 1,000 mg by mouth 2 (two) times daily.     Multiple Vitamins-Minerals (CENTRUM MEN) TABS Take 1 tablet by mouth daily.     No current facility-administered medications for this visit.    Allergies  Allergen Reactions   Heparin Hives      Social History   Socioeconomic History   Marital status: Married    Spouse name: Corey Perkins   Number of children: 2   Years of education: Not on file   Highest education level: High school graduate  Occupational History    Comment: currently not working,  Tobacco Use   Smoking status: Former    Years: 20.00    Types: Cigarettes    Quit date: 09/12/2021    Years since quitting: 0.7   Smokeless tobacco: Never  Vaping Use   Vaping Use: Never used  Substance and Sexual Activity   Alcohol use: Not Currently    Comment: quit in 08/2021   Drug use: Yes    Types: Marijuana    Comment: years ago   Sexual activity: Not on file  Other Topics Concern   Not on file  Social History Narrative   Not on file   Social Determinants of Health   Financial Resource Strain: Not on file  Food Insecurity: No Food Insecurity (06/05/2022)   Hunger Vital Sign    Worried About Running Out of Food in the Last Year: Never true    Ran Out of Food in the Last Year: Never true  Transportation Needs: No Transportation Needs (06/05/2022)   PRAPARE - Hydrologist (Medical): No    Lack of Transportation (Non-Medical): No  Physical Activity: Not on file  Stress: Not on file  Social Connections: Not on file  Intimate Partner Violence: Not on file     No family history on file.  There were no vitals filed for this visit.  PHYSICAL EXAM: General:  Well appearing. No respiratory difficulty HEENT: normal Neck: supple. no JVD. Carotids 2+ bilat; no  bruits. No lymphadenopathy or thryomegaly appreciated. Cor: PMI nondisplaced. Regular rate & rhythm. No rubs, gallops or murmurs. Lungs: clear Abdomen: soft, nontender, nondistended. No hepatosplenomegaly. No bruits or masses. Good bowel sounds. Extremities: no cyanosis, clubbing, rash, edema Neuro: alert & oriented x 3, cranial nerves grossly intact. moves all 4 extremities w/o difficulty. Affect pleasant.  ECG:   ASSESSMENT & PLAN: 1. HFmEF Echo Ef 40-45%. Suspected tachy-mediated  NYHA *** GDMT  Diuretic- BB- Ace/ARB/ARNI MRA SGLT2i  2. Paroxysmal A flutter He has f/u  with Dr Quentin Ore  On eliquis 5 mg twice a day  Referred to HFSW (PCP, Medications, Transportation, ETOH Abuse, Drug Abuse, Insurance, Financial ): Yes or No Refer to Pharmacy: Yes or No Refer to Home Health: Yes on No Refer to Advanced Heart Failure Clinic: Yes or no  Refer to General Cardiology: Yes or No  Follow up   Corey Perkins 2:07 PM

## 2022-06-17 NOTE — Addendum Note (Signed)
Addended by: Sampson Goon on: 06/17/2022 08:09 AM   Modules accepted: Orders

## 2022-06-18 ENCOUNTER — Encounter (HOSPITAL_COMMUNITY): Payer: Self-pay

## 2022-06-18 ENCOUNTER — Telehealth (HOSPITAL_COMMUNITY): Payer: Self-pay

## 2022-06-18 ENCOUNTER — Ambulatory Visit (HOSPITAL_COMMUNITY)
Admit: 2022-06-18 | Discharge: 2022-06-18 | Disposition: A | Discharge status: Home / Self Care | Payer: BLUE CROSS/BLUE SHIELD | Source: Ambulatory Visit | Attending: Adult Health | Admitting: Adult Health

## 2022-06-18 VITALS — BP 146/78 | HR 60

## 2022-06-18 DIAGNOSIS — E119 Type 2 diabetes mellitus without complications: Secondary | ICD-10-CM | POA: Insufficient documentation

## 2022-06-18 DIAGNOSIS — R0602 Shortness of breath: Secondary | ICD-10-CM | POA: Diagnosis present

## 2022-06-18 DIAGNOSIS — I110 Hypertensive heart disease with heart failure: Secondary | ICD-10-CM | POA: Insufficient documentation

## 2022-06-18 DIAGNOSIS — Z7901 Long term (current) use of anticoagulants: Secondary | ICD-10-CM | POA: Insufficient documentation

## 2022-06-18 DIAGNOSIS — Z79899 Other long term (current) drug therapy: Secondary | ICD-10-CM | POA: Diagnosis not present

## 2022-06-18 DIAGNOSIS — Z7984 Long term (current) use of oral hypoglycemic drugs: Secondary | ICD-10-CM | POA: Diagnosis not present

## 2022-06-18 DIAGNOSIS — I484 Atypical atrial flutter: Secondary | ICD-10-CM | POA: Insufficient documentation

## 2022-06-18 DIAGNOSIS — I5022 Chronic systolic (congestive) heart failure: Secondary | ICD-10-CM | POA: Insufficient documentation

## 2022-06-18 LAB — BASIC METABOLIC PANEL
Anion gap: 9 (ref 5–15)
BUN: 17 mg/dL (ref 8–23)
CO2: 25 mmol/L (ref 22–32)
Calcium: 9.6 mg/dL (ref 8.9–10.3)
Chloride: 103 mmol/L (ref 98–111)
Creatinine, Ser: 1.01 mg/dL (ref 0.61–1.24)
GFR, Estimated: >60 mL/min (ref >60)
Glucose, Bld: 148 mg/dL — ABNORMAL HIGH (ref 70–99)
Potassium: 4.5 mmol/L (ref 3.5–5.1)
Sodium: 137 mmol/L (ref 135–145)

## 2022-06-18 MED ORDER — EMPAGLIFLOZIN 10 MG PO TABS
10.0000 mg | ORAL_TABLET | Freq: Every day | ORAL | 3 refills | Status: DC
Start: 2022-06-18 — End: 2022-10-02

## 2022-06-18 NOTE — Telephone Encounter (Signed)
Called to confirm Heart & Vascular Transitions of Care appointment at [facility]. Patient reminded to bring all medications and pill box organizer with them. Confirmed patient has transportation. Gave directions, instructed to utilize valet parking.  Confirmed appointment prior to ending call.

## 2022-06-18 NOTE — Patient Instructions (Addendum)
Medication Changes:  START Jardiance 10mg . Take one tablet daily.   STOP Lisinopril on JULY 9 prior to your appointment scheduled for July 10.   Lab Work:  Labs done today, your results will be available in MyChart, we will contact you for abnormal readings.   Special Instructions // Education:  Do the following things EVERYDAY: Weigh yourself in the morning before breakfast. Write it down and keep it in a log. Take your medicines as prescribed Eat low salt foods--Limit salt (sodium) to 2000 mg per day.  Stay as active as you can everyday Limit all fluids for the day to less than 2 liters  Follow-Up in: 3 weeks. July 10 @ 1pm

## 2022-06-27 ENCOUNTER — Ambulatory Visit: Payer: BLUE CROSS/BLUE SHIELD | Admitting: Cardiology

## 2022-06-27 ENCOUNTER — Encounter: Payer: Self-pay | Admitting: Cardiology

## 2022-06-27 VITALS — BP 124/80 | HR 74 | Ht 73.0 in | Wt 214.8 lb

## 2022-06-27 DIAGNOSIS — I484 Atypical atrial flutter: Secondary | ICD-10-CM | POA: Diagnosis not present

## 2022-06-27 DIAGNOSIS — I5022 Chronic systolic (congestive) heart failure: Secondary | ICD-10-CM | POA: Diagnosis not present

## 2022-06-27 DIAGNOSIS — I4891 Unspecified atrial fibrillation: Secondary | ICD-10-CM | POA: Diagnosis not present

## 2022-06-27 MED ORDER — AMIODARONE HCL 200 MG PO TABS
200.0000 mg | ORAL_TABLET | Freq: Every day | ORAL | 3 refills | Status: AC
Start: 2022-06-27 — End: ?

## 2022-06-27 NOTE — Progress Notes (Signed)
Electrophysiology Office Note:    Date:  06/27/2022   ID:  Corey Perkins, DOB 03-Oct-1961, MRN [id number]  PCP:  Pcp, No  [facility] Cardiologist:  None  [facility] Electrophysiologist:  Lanier Prude, MD   Referring MD: No ref. provider found   Chief Complaint: New patient ICD check  History of Present Illness:    Corey Perkins is a 61 y.o. male who presents for St. Jude dual ICD device check and to establish care in [place]. Their medical history includes mitral valve annuloplasty ring repair 2016, A Flutter ablation 2016, ICD St Jude, HTN, diabetes mellitus, and hepatitis C.  Shocked multiple times 08/2021. After developing recurrent A flutter he underwent another ablation 2022. Later developed SVT in 03/2022.  In 04/2022 he moved to [facility] from [place] where he was followed by cardiologist Dr. Etheleen Sia. Currently established with Dr. Royann Shivers.  He presented to [facility] ED 05/28/22 with tachycardia for 24 hours assicated with mild SOB. His EKG was consistent with atrial flutter; he underwent DCCV and was resumed on Eliquis. Previously off Eliquis due to hemoptysis.  He again presented on 06/02/22 after receiving an ICD shock. Interrogation of his defibrillator showed 3 episodes of nonsustained atrial flutter with 1:1 AV conduction that resolved spontaneously, but then he had a sustained episode at 226 bpm. Pace-termination of his AFlutter by Dr. Royann Shivers 06/05/22. He was discharged on amiodarone 400 mg BID for 4 days then 200 mg daily. He was referred for outpatient follow up with EP and is currently scheduled for Afib ablation on 09/27/2022.  Today, he is accompanied by a family member. He states that he doesn't feel great physically. He is unable to do much without feeling weak and lightheaded. Every time he feels lightheaded, he is concerned that he will receive another ICD shock. When walking to a building on his property 30 feet away with a slight incline, he will feel short winded  and sometimes feels like he will pass out.  At night he lies flat in bed, not propped up with pillows. His weight has been stable.  A few weeks ago he was started on Jardiance. Soon his lisinopril will be switched to entresto.  They deny any palpitations, chest pain, or peripheral edema. No headaches, orthopnea, or PND.     Past Medical History:  Diagnosis Date   Diabetes mellitus without complication (HCC)    Hepatitis C     Past Surgical History:  Procedure Laterality Date   ICD IMPLANT     KNEE SURGERY     VALVE REPLACEMENT      Current Medications: Current Meds  Medication Sig   carvedilol (COREG) 25 MG tablet Take 25 mg by mouth 2 (two) times daily.   ELIQUIS 5 MG TABS tablet Take 5 mg by mouth 2 (two) times daily.   empagliflozin (JARDIANCE) 10 MG TABS tablet Take 1 tablet (10 mg total) by mouth daily before breakfast.   lisinopril (ZESTRIL) 10 MG tablet Take 10 mg by mouth daily.   metFORMIN (GLUCOPHAGE) 1000 MG tablet Take 1,000 mg by mouth 2 (two) times daily.   Multiple Vitamins-Minerals (CENTRUM MEN) TABS Take 1 tablet by mouth daily.   [DISCONTINUED] amiodarone (PACERONE) 200 MG tablet Take 200 mg by mouth daily.     Allergies:   Heparin   Social History   Socioeconomic History   Marital status: Married    Spouse name: Corey Perkins   Number of children: 2   Years of education: Not on  file   Highest education level: High school graduate  Occupational History    Comment: currently not working,  Tobacco Use   Smoking status: Former    Years: 20.00    Types: Cigarettes    Quit date: 09/12/2021    Years since quitting: 0.7   Smokeless tobacco: Never  Vaping Use   Vaping Use: Never used  Substance and Sexual Activity   Alcohol use: Not Currently    Comment: quit in 08/2021   Drug use: Yes    Types: Marijuana    Comment: years ago   Sexual activity: Not on file  Other Topics Concern   Not on file  Social History Narrative   Not on file   Social  Determinants of Health   Financial Resource Strain: Not on file  Food Insecurity: No Food Insecurity (06/05/2022)   Hunger Vital Sign    Worried About Running Out of Food in the Last Year: Never true    Ran Out of Food in the Last Year: Never true  Transportation Needs: No Transportation Needs (06/05/2022)   PRAPARE - Administrator, Civil Service (Medical): No    Lack of Transportation (Non-Medical): No  Physical Activity: Not on file  Stress: Not on file  Social Connections: Not on file     Family History: The patient's family history is not on file.  ROS:   Please see the history of present illness.    (+) Generalized weakness (+) Lightheadedness (+) Shortness of breath (+) Presyncope All other systems reviewed and are negative.  EKGs/Labs/Other Studies Reviewed:    The following studies were reviewed today:  In clinic device interrogation personally reviewed: Battery longevity 2.1 years Lead parameters stable Mode switch less than 1% High-voltage therapies delivered June 4 for atrial fibrillation with RVR  06/03/2022  Echocardiogram:  1. Left ventricular ejection fraction, by estimation, is 40 to 45%. The  left ventricle has mildly decreased function. The left ventricle  demonstrates global hypokinesis. There is mild concentric left ventricular  hypertrophy. Indeterminate diastolic  filling due to E-A fusion.   2. Right ventricular systolic function is mildly reduced. The right  ventricular size is normal.   3. The mitral valve has been repaired/replaced. No evidence of mitral  valve regurgitation. No evidence of mitral stenosis. There is a prosthetic  annuloplasty ring present in the mitral position.   4. Tricuspid valve regurgitation is mild to moderate.   5. The aortic valve is tricuspid. Aortic valve regurgitation is not  visualized. Aortic valve sclerosis is present, with no evidence of aortic  valve stenosis.   6. There is borderline dilatation of  the aortic root, measuring 37 mm.  There is mild dilatation of the ascending aorta, measuring 38 mm.   7. The inferior vena cava is normal in size with greater than 50%  respiratory variability, suggesting right atrial pressure of 3 mmHg.   Comparison(s): No prior Echocardiogram.    EKG:   EKG is personally reviewed.     Recent Labs: 06/02/2022: ALT 25; B Natriuretic Peptide 105.7; Hemoglobin 13.6; Platelets 110; TSH 3.096 06/05/2022: Magnesium 2.2 06/18/2022: BUN 17; Creatinine, Ser 1.01; Potassium 4.5; Sodium 137   Recent Lipid Panel    Component Value Date/Time   CHOL 191 06/04/2022 0403   TRIG 187 (H) 06/04/2022 0403   HDL 36 (L) 06/04/2022 0403   CHOLHDL 5.3 06/04/2022 0403   VLDL 37 06/04/2022 0403   LDLCALC 118 (H) 06/04/2022 0403    Physical  Exam:    VS:  BP 124/80   Pulse 74   Ht 6\' 1"  (1.854 m)   Wt 214 lb 12.8 oz (97.4 kg)   SpO2 95%   BMI 28.34 kg/m     Wt Readings from Last 3 Encounters:  06/27/22 214 lb 12.8 oz (97.4 kg)  06/02/22 219 lb 12.8 oz (99.7 kg)  05/28/22 220 lb (99.8 kg)     GEN: Well nourished, well developed in no acute distress HEENT: Normal NECK: No JVD; No carotid bruits LYMPHATICS: No lymphadenopathy CARDIAC: RRR, no murmurs, rubs, gallops; Device pocket well healed. RESPIRATORY:  Clear to auscultation without rales, wheezing or rhonchi  ABDOMEN: Soft, non-tender, non-distended MUSCULOSKELETAL:  No edema; No deformity  SKIN: Warm and dry NEUROLOGIC:  Alert and oriented x 3 PSYCHIATRIC:  Normal affect       ASSESSMENT:    1. Chronic systolic heart failure ([place])   2. Atrial fibrillation, unspecified type ([place])   3. Atypical atrial flutter (HCC)    PLAN:    In order of problems listed above:  #Atrial fibrillation #Atypical atrial flutter The patient is thankfully maintaining normal rhythm on his current medication regimen including amiodarone.  He is on Eliquis for stroke prophylaxis.  His atrial fibrillation has been very  rapidly conducted in the past leading to inappropriate ICD shocks.  I have recommended catheter ablation to help reduce the risks of inappropriate shocks and also to treat his atrial fibrillation while it is still intermittent.  I discussed the catheter ablation procedure in detail again during today's visit and he still wishes to proceed.  The goal will be to stop the amiodarone at the 54-month mark after his ablation.  #Chronic systolic heart failure #ICD in situ Device functioning appropriately.  NYHA class II-III today.  Warm and dry on exam.  Continue Coreg, Jardiance, lisinopril.    I will put a referral in to the ICD support group and Elias Else, PsyD given the patient's anxiety surrounding his defibrillator and recent ICD therapies.  He is very interested in establishing with Dr. Michail Sermon.  Total time spent with patient today 45 minutes. This includes reviewing records, evaluating the patient and coordinating care.  Medication Adjustments/Labs and Tests Ordered: Current medicines are reviewed at length with the patient today.  Concerns regarding medicines are outlined above.  Orders Placed This Encounter  Procedures   Ambulatory referral to Psychology   Meds ordered this encounter  Medications   amiodarone (PACERONE) 200 MG tablet    Sig: Take 1 tablet (200 mg total) by mouth daily.    Dispense:  90 tablet    Refill:  3    I,Mathew Stumpf,acting as a scribe for Vickie Epley, MD.,have documented all relevant documentation on the behalf of Vickie Epley, MD,as directed by  Vickie Epley, MD while in the presence of Vickie Epley, MD.  I, Vickie Epley, MD, have reviewed all documentation for this visit. The documentation on 06/27/22 for the exam, diagnosis, procedures, and orders are all accurate and complete.   Signed, Hilton Cork. Quentin Ore, MD, [facility], [facility] 06/27/2022 8:51 PM    Electrophysiology [facility]

## 2022-06-27 NOTE — Patient Instructions (Addendum)
Medication Instructions:  Your physician recommends that you continue on your current medications as directed. Please refer to the Current Medication list given to you today. *If you need a refill on your cardiac medications before your next appointment, please call your pharmacy*  Lab Work: None. If you have labs (blood work) drawn today and your tests are completely normal, you will receive your results only by: MyChart Message (if you have MyChart) OR A paper copy in the mail If you have any lab test that is abnormal or we need to change your treatment, we will call you to review the results.  Testing/Procedures: Your physician has requested that you have cardiac CT. Cardiac computed tomography (CT) is a painless test that uses an x-ray machine to take clear, detailed pictures of your heart. For further information please visit [email or web address] Please follow instruction sheet as given.  Your physician has recommended that you have an ablation. Catheter ablation is a medical procedure used to treat some cardiac arrhythmias (irregular heartbeats). During catheter ablation, a long, thin, flexible tube is put into a blood vessel in your groin (upper thigh), or neck. This tube is called an ablation catheter. It is then guided to your heart through the blood vessel. Radio frequency waves destroy small areas of heart tissue where abnormal heartbeats may cause an arrhythmia to start. Please see the instruction sheet given to you today.   Follow-Up: At [facility], you and your health needs are our priority.  As part of our continuing mission to provide you with exceptional heart care, we have created designated Provider Care Teams.  These Care Teams include your primary Cardiologist (physician) and Advanced Practice Providers (APPs -  Physician Assistants and Nurse Practitioners) who all work together to provide you with the care you need, when you need it.  Your physician wants you to  follow-up in: See instruction letter.   We recommend signing up for the patient portal called "MyChart".  Sign up information is provided on this After Visit Summary.  MyChart is used to connect with patients for Virtual Visits (Telemedicine).  Patients are able to view lab/test results, encounter notes, upcoming appointments, etc.  Non-urgent messages can be sent to your provider as well.   To learn more about what you can do with MyChart, go to [email or web address].    Any Other Special Instructions Will Be Listed Below (If Applicable).  Cardiac Ablation Cardiac ablation is a procedure to destroy (ablate) some heart tissue that is sending bad signals. These bad signals cause problems in heart rhythm. The heart has many areas that make these signals. If there are problems in these areas, they can make the heart beat in a way that is not normal. Destroying some tissues can help make the heart rhythm normal. Tell your doctor about: Any allergies you have. All medicines you are taking. These include vitamins, herbs, eye drops, creams, and over-the-counter medicines. Any problems you or family members have had with medicines that make you fall asleep (anesthetics). Any blood disorders you have. Any surgeries you have had. Any medical conditions you have, such as kidney failure. Whether you are pregnant or may be pregnant. What are the risks? This is a safe procedure. But problems may occur, including: Infection. Bruising and bleeding. Bleeding into the chest. Stroke or blood clots. Damage to nearby areas of your body. Allergies to medicines or dyes. The need for a pacemaker if the normal system is damaged. Failure of the procedure to treat the problem. What  happens before the procedure? Medicines Ask your doctor about: Changing or stopping your normal medicines. This is important. Taking aspirin and ibuprofen. Do not take these medicines unless your doctor tells you to take  them. Taking other medicines, vitamins, herbs, and supplements. General instructions Follow instructions from your doctor about what you cannot eat or drink. Plan to have someone take you home from the hospital or clinic. If you will be going home right after the procedure, plan to have someone with you for 24 hours. Ask your doctor what steps will be taken to prevent infection. What happens during the procedure?  An IV tube will be put into one of your veins. You will be given a medicine to help you relax. The skin on your neck or groin will be numbed. A cut (incision) will be made in your neck or groin. A needle will be put through your cut and into a large vein. A tube (catheter) will be put into the needle. The tube will be moved to your heart. Dye may be put through the tube. This helps your doctor see your heart. Small devices (electrodes) on the tube will send out signals. A type of energy will be used to destroy some heart tissue. The tube will be taken out. Pressure will be held on your cut. This helps stop bleeding. A bandage will be put over your cut. The exact procedure may vary among doctors and hospitals. What happens after the procedure? You will be watched until you leave the hospital or clinic. This includes checking your heart rate, breathing rate, oxygen, and blood pressure. Your cut will be watched for bleeding. You will need to lie still for a few hours. Do not drive for 24 hours or as long as your doctor tells you. Summary Cardiac ablation is a procedure to destroy some heart tissue. This is done to treat heart rhythm problems. Tell your doctor about any medical conditions you may have. Tell him or her about all medicines you are taking to treat them. This is a safe procedure. But problems may occur. These include infection, bruising, bleeding, and damage to nearby areas of your body. Follow what your doctor tells you about food and drink. You may also be told to  change or stop some of your medicines. After the procedure, do not drive for 24 hours or as long as your doctor tells you. This information is not intended to replace advice given to you by your health care provider. Make sure you discuss any questions you have with your health care provider. Document Revised: 11/18/2019 Document Reviewed: 11/18/2019 Elsevier Patient Education  2023 [employer].

## 2022-07-08 ENCOUNTER — Telehealth (HOSPITAL_COMMUNITY): Payer: Self-pay | Admitting: *Deleted

## 2022-07-08 ENCOUNTER — Encounter: Payer: Self-pay | Admitting: Nurse Practitioner

## 2022-07-08 ENCOUNTER — Other Ambulatory Visit: Payer: Self-pay | Admitting: Nurse Practitioner

## 2022-07-08 ENCOUNTER — Ambulatory Visit: Payer: BLUE CROSS/BLUE SHIELD | Attending: Nurse Practitioner | Admitting: Nurse Practitioner

## 2022-07-08 ENCOUNTER — Encounter (HOSPITAL_COMMUNITY): Payer: Self-pay

## 2022-07-08 ENCOUNTER — Ambulatory Visit (HOSPITAL_COMMUNITY)
Admission: RE | Admit: 2022-07-08 | Discharge: 2022-07-08 | Disposition: A | Discharge status: Home / Self Care | Payer: BLUE CROSS/BLUE SHIELD | Source: Ambulatory Visit | Attending: Cardiology | Admitting: Cardiology

## 2022-07-08 VITALS — BP 158/72 | HR 60 | Wt 218.4 lb

## 2022-07-08 VITALS — BP 146/76 | HR 63 | Wt 217.8 lb

## 2022-07-08 DIAGNOSIS — E119 Type 2 diabetes mellitus without complications: Secondary | ICD-10-CM | POA: Insufficient documentation

## 2022-07-08 DIAGNOSIS — Z7984 Long term (current) use of oral hypoglycemic drugs: Secondary | ICD-10-CM | POA: Insufficient documentation

## 2022-07-08 DIAGNOSIS — I484 Atypical atrial flutter: Secondary | ICD-10-CM | POA: Diagnosis not present

## 2022-07-08 DIAGNOSIS — Z09 Encounter for follow-up examination after completed treatment for conditions other than malignant neoplasm: Secondary | ICD-10-CM

## 2022-07-08 DIAGNOSIS — I110 Hypertensive heart disease with heart failure: Secondary | ICD-10-CM | POA: Insufficient documentation

## 2022-07-08 DIAGNOSIS — I5020 Unspecified systolic (congestive) heart failure: Secondary | ICD-10-CM | POA: Insufficient documentation

## 2022-07-08 DIAGNOSIS — E785 Hyperlipidemia, unspecified: Secondary | ICD-10-CM

## 2022-07-08 DIAGNOSIS — Z7689 Persons encountering health services in other specified circumstances: Secondary | ICD-10-CM | POA: Diagnosis not present

## 2022-07-08 DIAGNOSIS — I4892 Unspecified atrial flutter: Secondary | ICD-10-CM | POA: Diagnosis not present

## 2022-07-08 DIAGNOSIS — I5022 Chronic systolic (congestive) heart failure: Secondary | ICD-10-CM | POA: Diagnosis not present

## 2022-07-08 DIAGNOSIS — Z79899 Other long term (current) drug therapy: Secondary | ICD-10-CM | POA: Insufficient documentation

## 2022-07-08 LAB — TSH: TSH: 6.923 u[IU]/mL — ABNORMAL HIGH (ref 0.350–4.500)

## 2022-07-08 LAB — COMPREHENSIVE METABOLIC PANEL
ALT: 21 U/L (ref 0–44)
AST: 18 U/L (ref 15–41)
Albumin: 4 g/dL (ref 3.5–5.0)
Alkaline Phosphatase: 58 U/L (ref 38–126)
Anion gap: 10 (ref 5–15)
BUN: 23 mg/dL (ref 8–23)
CO2: 26 mmol/L (ref 22–32)
Calcium: 9.6 mg/dL (ref 8.9–10.3)
Chloride: 104 mmol/L (ref 98–111)
Creatinine, Ser: 1.28 mg/dL — ABNORMAL HIGH (ref 0.61–1.24)
GFR, Estimated: >60 mL/min (ref >60)
Glucose, Bld: 167 mg/dL — ABNORMAL HIGH (ref 70–99)
Potassium: 4.7 mmol/L (ref 3.5–5.1)
Sodium: 140 mmol/L (ref 135–145)
Total Bilirubin: 0.6 mg/dL (ref 0.3–1.2)
Total Protein: 7.6 g/dL (ref 6.5–8.1)

## 2022-07-08 LAB — CBC
HCT: 40.2 % (ref 39.0–52.0)
Hemoglobin: 14.1 g/dL (ref 13.0–17.0)
MCH: 32.9 pg (ref 26.0–34.0)
MCHC: 35.1 g/dL (ref 30.0–36.0)
MCV: 93.7 fL (ref 80.0–100.0)
Platelets: 119 10*3/uL — ABNORMAL LOW (ref 150–400)
RBC: 4.29 MIL/uL (ref 4.22–5.81)
RDW: 12.7 % (ref 11.5–15.5)
WBC: 7.1 10*3/uL (ref 4.0–10.5)
nRBC: 0 % (ref 0.0–0.2)

## 2022-07-08 LAB — T4, FREE: Free T4: 0.8 ng/dL (ref 0.61–1.12)

## 2022-07-08 MED ORDER — ENTRESTO 49-51 MG PO TABS: 1.0000 | ORAL_TABLET | Freq: Two times a day (BID) | ORAL | 3 refills | Status: DC

## 2022-07-08 MED ORDER — ATORVASTATIN CALCIUM 20 MG PO TABS: 20.0000 mg | ORAL_TABLET | Freq: Every day | ORAL | 3 refills | Status: AC

## 2022-07-08 MED ORDER — ROSUVASTATIN CALCIUM 10 MG PO TABS: 10.0000 mg | ORAL_TABLET | Freq: Every day | ORAL | 3 refills | Status: DC

## 2022-07-08 NOTE — Progress Notes (Signed)
HEART & VASCULAR TRANSITION OF CARE PROGRESS NOTE     Referring Physician: Dr Sallyanne Kuster Primary Care: Establishing with [facility]  Primary Cardiologist: Dr Sallyanne Kuster. (Previously followed by Dr Merrilyn Puma in [place]) EP: Dr Quentin Ore   HPI: Referred to clinic by Dr Sallyanne Kuster for heart failure consultation.   Mr Strube is a 61 year old with h/o mitral valve annuloplasty ring repair 2016, A Flutter ablation 2016, ICD St Jude, HTN, and Hep C. Had cath 2015 with minor luminal irregularities. PET scan in October 2021 did show a very small reversible defect (rest EF 53%, stress EF 64%).  Shocked multiple times 08/2021. Had recurrent A flutter. He had another ablation 2022.   Had EGD 2023 with no bleeding, varices,  and normal duodenum. Recommended repeat EGD but he does not want to pursue.   Had SVT 03/2022.   Moved from [place] to [facility] May 2023.   Presented to [facility]  05/28/22 with tachycardia of about 24 hours associated with mild SOB, suspected he was in AFib, EKG was c/w AFlutter, given a bolus of diltiazem and ultimately underwent DCCV in the ER. He had been off Eliquis  due to some previous hemoptysis, though instructed to resume.    Presented to [facility] 06/02/22 after ICD shock. Interrogation of his defibrillator showed 3 episodes of nonsustained atrial flutter with 1:1 AV conduction that resolved spontaneously but then returned w/ sustained episode at 226 bpm, placing him in the detection zone for ventricular fibrillation (greater than 222 bpm).  ATP during charge was unsuccessful and he received a single high-voltage shock with conversion to normal rhythm. Placed on amio drip. Pace-termination of his AFlutter by Dr. Sallyanne Kuster 06/05/22. Amiodarone not felt to be a good long-term strategy, with plans to pursue ablation in the coming months for his  AFlutter. Echo EF 40-45% . Discharged to home 06/05/22 with addition of amio 400 mg twice a day x 4 days then 200 mg daily. [facility]   Initial TOC f/u,  volume status was stable. NYHA Class II-III. Jardiance added w/ plans to add Entresto next. He was instructed to stop his lisinopril the day before he next appt in anticipation of transition. Also of note, he was outpatient f/u w/ EP on 6/29 and ablation was discussed. This is scheduled for September.   He returns today for f/u. Here w/ his wife. Reports that since starting Jardiance he just hasn't "felt right". Hard for him to describe symptoms. Still SOB w/ exertion, NYHA class II. Felt dizzy yesterday while taking a shower but no syncope/ near syncope. Denies CP and palpitations. Fully compliant w/ Amiodarone. He can usually tell when he is in Afib. RRR on exam w/ pulse rate in the 60s. BP elevated at 158/72. He stopped lisinopril as directed, last dose was 7/8.      Cardiac Testing  Echo 05/2022  1. Left ventricular ejection fraction, by estimation, is 40 to 45%. The  left ventricle has mildly decreased function. The left ventricle  demonstrates global hypokinesis. There is mild concentric left ventricular  hypertrophy. Indeterminate diastolic  filling due to E-A fusion.   2. Right ventricular systolic function is mildly reduced. The right  ventricular size is normal.   3. The mitral valve has been repaired/replaced. No evidence of mitral  valve regurgitation. No evidence of mitral stenosis. There is a prosthetic  annuloplasty ring present in the mitral position.   4. Tricuspid valve regurgitation is mild to moderate.   5. The aortic valve  is tricuspid. Aortic valve regurgitation is not  visualized. Aortic valve sclerosis is present, with no evidence of aortic  valve stenosis.   PET scan in October 2021 did show a very small reversible defect (rest EF 53%, stress EF 64%).  LHC  2015 with minor luminal irregularities.   Review of Systems: [y] = yes, [ ]  = no   General: Weight gain [ ] ; Weight loss [ ] ; Anorexia [ ] ; Fatigue [Y ]; Fever [ ] ; Chills [ ] ; Weakness [ ]   Cardiac: Chest  pain/pressure [ ] ; Resting SOB [ ] ; Exertional SOB [ Y]; Orthopnea [ ] ; Pedal Edema [ ] ; Palpitations [ ] ; Syncope [ ] ; Presyncope [ ] ; Paroxysmal nocturnal dyspnea[ ]   Pulmonary: Cough [ ] ; Wheezing[ ] ; Hemoptysis[ ] ; Sputum [ ] ; Snoring [ ]   GI: Vomiting[ ] ; Dysphagia[ ] ; Melena[ ] ; Hematochezia [ ] ; Heartburn[ ] ; Abdominal pain [ ] ; Constipation [ ] ; Diarrhea [ ] ; BRBPR [ ]   GU: Hematuria[ ] ; Dysuria [ ] ; Nocturia[ ]   Vascular: Pain in legs with walking [ ] ; Pain in feet with lying flat [ ] ; Non-healing sores [ ] ; Stroke [ ] ; TIA [ ] ; Slurred speech [ ] ;  Neuro: Headaches[ ] ; Vertigo[ ] ; Seizures[ ] ; Paresthesias[ ] ;Blurred vision [ ] ; Diplopia [ ] ; Vision changes [ ]   Ortho/Skin: Arthritis [ ] ; Joint pain [ ] ; Muscle pain [ ] ; Joint swelling [ ] ; Back Pain [ ] ; Rash [ ]   Psych: Depression[ ] ; Anxiety[ ]   Heme: Bleeding problems [ ] ; Clotting disorders [ ] ; Anemia [ ]   Endocrine: Diabetes [ Y]; Thyroid dysfunction[ ]    Past Medical History:  Diagnosis Date   Diabetes mellitus without complication (HCC)    Hepatitis C     Current Outpatient Medications  Medication Sig Dispense Refill   amiodarone (PACERONE) 200 MG tablet Take 1 tablet (200 mg total) by mouth daily. 90 tablet 3   carvedilol (COREG) 25 MG tablet Take 25 mg by mouth 2 (two) times daily.     ELIQUIS 5 MG TABS tablet Take 5 mg by mouth 2 (two) times daily.     empagliflozin (JARDIANCE) 10 MG TABS tablet Take 1 tablet (10 mg total) by mouth daily before breakfast. 30 tablet 3   metFORMIN (GLUCOPHAGE) 1000 MG tablet Take 1,000 mg by mouth 2 (two) times daily.     Multiple Vitamins-Minerals (CENTRUM MEN) TABS Take 1 tablet by mouth daily.     sacubitril-valsartan (ENTRESTO) 49-51 MG Take 1 tablet by mouth 2 (two) times daily. 60 tablet 3   No current facility-administered medications for this encounter.    Allergies  Allergen Reactions   Heparin Hives      Social History   Socioeconomic History   Marital status:  Married    Spouse name: Kalik Hoare   Number of children: 2   Years of education: Not on file   Highest education level: High school graduate  Occupational History    Comment: currently not working,  Tobacco Use   Smoking status: Former    Years: 20.00    Types: Cigarettes    Quit date: 09/12/2021    Years since quitting: 0.8   Smokeless tobacco: Never  Vaping Use   Vaping Use: Never used  Substance and Sexual Activity   Alcohol use: Not Currently    Comment: quit in 08/2021   Drug use: Yes    Types: Marijuana    Comment: years ago   Sexual activity: Not on file  Other Topics  Concern   Not on file  Social History Narrative   Not on file   Social Determinants of Health   Financial Resource Strain: Not on file  Food Insecurity: No Food Insecurity (06/05/2022)   Hunger Vital Sign    Worried About Running Out of Food in the Last Year: Never true    Ran Out of Food in the Last Year: Never true  Transportation Needs: No Transportation Needs (06/05/2022)   PRAPARE - Administrator, Civil Service (Medical): No    Lack of Transportation (Non-Medical): No  Physical Activity: Not on file  Stress: Not on file  Social Connections: Not on file  Intimate Partner Violence: Not on file      Family History  Problem Relation Age of Onset   Hypertension Father     Vitals:   07/08/22 1314  BP: (!) 158/72  Pulse: 60  SpO2: 98%  Weight: 99.1 kg (218 lb 6.4 oz)    Wt Readings from Last 3 Encounters:  07/08/22 99.1 kg (218 lb 6.4 oz)  06/27/22 97.4 kg (214 lb 12.8 oz)  06/02/22 99.7 kg (219 lb 12.8 oz)    PHYSICAL EXAM: ReDS Clip 38%  General:  Well appearing. No respiratory difficulty HEENT: normal Neck: supple. no JVD. Carotids 2+ bilat; no bruits. No lymphadenopathy or thyromegaly appreciated. Cor: PMI nondisplaced. Regular rate & rhythm. No rubs, gallops or murmurs. Lungs: clear Abdomen: soft, nontender, nondistended. No hepatosplenomegaly. No bruits or masses.  Good bowel sounds. Extremities: no cyanosis, clubbing, rash, edema Neuro: alert & oriented x 3, cranial nerves grossly intact. moves all 4 extremities w/o difficulty. Affect pleasant.   ECG: not performed    ASSESSMENT & PLAN: 1. HFmEF - 05/2022 Echo down EF 40-45%. Suspected tachy-mediated  - NYHA II. Has St Jude ICD (unable to interrogate device today)  - ReDS Clip 38%  - Continue Jardiance 10 mg daily  - Start Entresto 49-51 mg bid   - Continue carvedilol 25 mg bid  - Check BMP today + again in 7 days  - Consider addition of spiro next if renal fx allows   - Return to gen cards for further management   2. Paroxysmal A flutter - H/O ablation 2016 and 2022 - RRR on exam today  - Scheduled for re-do ablation w/ Dr. Lalla Brothers in Sep - Continue amio 200 mg daily. Check TFTs and HFTs. Reminded to get annual eye exam  - On eliquis 5 mg twice. BID. Denies abnormal bleeding. Check CBC  3. DMII - On metformin + Jardiance  - Hgb A1C 5.9    Referred to HFSW (PCP, Medications, Transportation, ETOH Abuse, Drug Abuse, Insurance, Financial ):  No Refer to Pharmacy: No  Refer to Home Health: No Refer to Advanced Heart Failure Clinic: No  Refer to General Cardiology: Yes >>f/u w/ Dr. Royann Shivers   F/u w/ gen cards in 3-4 weeks for further med titration   Kimber Esterly  PA-C  1:48 PM

## 2022-07-08 NOTE — Progress Notes (Signed)
Assessment & Plan:  Corey Perkins was seen today for hospitalization follow-up and establish care.  Diagnoses and all orders for this visit:  Encounter to establish care  Hospital discharge follow-up  Dyslipidemia, goal LDL below 70 -     rosuvastatin (CRESTOR) 10 MG tablet; Take 1 tablet (10 mg total) by mouth daily.    Patient has been counseled on age-appropriate routine health concerns for screening and prevention. These are reviewed and up-to-date. Referrals have been placed accordingly. Immunizations are up-to-date or declined.    Subjective:   Chief Complaint  Patient presents with   Hospitalization Follow-up   Establish Care   HPI Corey Perkins 61 y.o. male presents to office today for hospital follow up and to establish care.   He relocated to [facility] from [place] several months ago.    Patient has been counseled on age-appropriate routine health concerns for screening and prevention. These are reviewed and up-to-date. Referrals have been placed accordingly. Immunizations are up-to-date or declined.     COLONOSCOPY: UTD. 19yr f/u. 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 Treated with Epclusa).   Blood pressure is elevated today however he was just started on Entresto  49-51 mg BID by Cardiology today. He is also taking carvedilol 25 mg BID and amiodarone 200 mg daily. On eliquis for stroke prevention.  BP Readings from Last 3 Encounters:  07/08/22 (!) 146/76  07/08/22 (!) 158/72  06/27/22 124/80     HFU Admitted 06-02-2022 due to ICD shock, aflutter/persistent Afib. Required pace termination of his aflutter by cardiology on 06-05-2022. will require afib ablation in September. TTE  reduced from his last echo in [place] was 50-55%  to 40-45%. Corey Perkins was also evaluated on 05-28-22 in  the ED with tachycardia and shortness of breath. EKG at that time showed Aflutter and he required DCCV and eliquis was resumed after procedure. He had previously not been taking this due to reported hemoptysis.    Today he does endorses dyspnea and fatigue with minimal activity. 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 Also has concerns of Erectile dysfunction. Unfortunately there is an interaction with amiodarone and cialis/levitra which could possibly cause QT prolongation, angina, HTN and MI. We may be able to revisit ED after his ablation as amiodarone could possibly be stopped 3 months after his procedure.    DM Well controlled. Started on jardiance a few weeks ago by Cardiology. He is also taking metformin 1000 mg BID.  Lab Results  Component Value Date   HGBA1C 5.9 (H) 06/04/2022   LDL not at goal. Will need to start statin.  Lab Results  Component Value Date   LDLCALC 118 (H) 06/04/2022   The 10-year ASCVD risk score (Arnett DK, et al., 2019) is: 29%   Values used to calculate the score:     Age: 36 years     Sex: Male     Is Non-Hispanic African American: No     Diabetic: Yes     Tobacco smoker: No     Systolic Blood Pressure: 146 mmHg     Is BP treated: Yes     HDL Cholesterol: 36 mg/dL     Total Cholesterol: 191 mg/dL      Review of Systems  Constitutional:  Positive  for malaise/fatigue. Negative for fever and weight loss.  HENT: Negative.  Negative for nosebleeds.   Eyes: Negative.  Negative for blurred vision, double vision and photophobia.  Respiratory:  Positive for shortness of breath. Negative for cough.   Cardiovascular: Negative.  Negative for chest pain, palpitations and leg swelling.  Gastrointestinal: Negative.  Negative for heartburn, nausea and vomiting.  Genitourinary:  Negative for dysuria, flank pain, frequency, hematuria and urgency.       Erectile dysfunction  Musculoskeletal: Negative.  Negative for myalgias.  Neurological: Negative.   Negative for dizziness, focal weakness, seizures and headaches.  Psychiatric/Behavioral: Negative.  Negative for suicidal ideas.       Past Medical History:  Diagnosis Date   Diabetes mellitus without complication (HCC)    Hepatitis C     Past Surgical History:  Procedure Laterality Date   ICD IMPLANT     KNEE SURGERY     VALVE REPLACEMENT      Family History  Problem Relation Age of Onset   Hypertension Father     Social History Reviewed with no changes to be made today.   Outpatient Medications Prior to Visit  Medication Sig Dispense Refill   amiodarone (PACERONE) 200 MG tablet Take 1 tablet (200 mg total) by mouth daily. 90 tablet 3   carvedilol (COREG) 25 MG tablet Take 25 mg by mouth 2 (two) times daily.     ELIQUIS 5 MG TABS tablet Take 5 mg by mouth 2 (two) times daily.     empagliflozin (JARDIANCE) 10 MG TABS tablet Take 1 tablet (10 mg total) by mouth daily before breakfast. 30 tablet 3   metFORMIN (GLUCOPHAGE) 1000 MG tablet Take 1,000 mg by mouth 2 (two) times daily.     Multiple Vitamins-Minerals (CENTRUM MEN) TABS Take 1 tablet by mouth daily.     sacubitril-valsartan (ENTRESTO) 49-51 MG Take 1 tablet by mouth 2 (two) times daily. 60 tablet 3   No facility-administered medications prior to visit.    Allergies  Allergen Reactions   Heparin Hives       Objective:    BP (!) 146/76 (BP Location: Left Arm, Cuff Size: Normal)   Pulse 63   Wt 217 lb 12.8 oz (98.8 kg)   SpO2 97%   BMI 28.74 kg/m  Wt Readings from Last 3 Encounters:  07/08/22 217 lb 12.8 oz (98.8 kg)  07/08/22 218 lb 6.4 oz (99.1 kg)  06/27/22 214 lb 12.8 oz (97.4 kg)    Physical Exam Vitals and nursing note reviewed.  Constitutional:      Appearance: He is well-developed.  HENT:     Head: Normocephalic and atraumatic.  Cardiovascular:     Rate and Rhythm: Normal rate and regular rhythm.     Heart sounds: Normal heart sounds. No murmur heard.    No friction rub. No gallop.   Pulmonary:     Effort: Pulmonary effort is normal. No tachypnea or respiratory distress.     Breath sounds: Normal breath sounds. No decreased breath sounds, wheezing, rhonchi or rales.  Chest:     Chest wall: No tenderness.  Abdominal:     General: Bowel sounds are normal.     Palpations: Abdomen is soft.  Musculoskeletal:        General: Normal range of motion.     Cervical back: Normal range of motion.  Skin:    General: Skin is warm and dry.  Neurological:     Mental Status: He is alert and oriented  to person, place, and time.     Coordination: Coordination normal.  Psychiatric:        Behavior: Behavior normal. Behavior is cooperative.        Thought Content: Thought content normal.        Judgment: Judgment normal.          Patient has been counseled extensively about nutrition and exercise as well as the importance of adherence with medications and regular follow-up. The patient was given clear instructions to go to ER or return to medical center if symptoms don't improve, worsen or new problems develop. The patient verbalized understanding.   Follow-up: Return in 6 months (on 01/08/2023).   Claiborne Rigg, FNP-BC [facility] [place], [place] [phone]   07/08/2022, 9:07 PM

## 2022-07-08 NOTE — Progress Notes (Signed)
ReDS Vest / Clip - 07/08/22 1300       ReDS Vest / Clip   Station Marker C    Ruler Value 30    ReDS Value Range Moderate volume overload    ReDS Actual Value 38

## 2022-07-08 NOTE — Telephone Encounter (Signed)
Called to confirm Heart & Vascular Transitions of Care appointment at 1 pm on 07/08/22. Patient reminded to bring all medications and pill box organizer with them. Confirmed patient has transportation. Gave directions, instructed to utilize valet parking.  Confirmed appointment prior to ending call.    Rhae Hammock, BSN, Scientist, clinical (histocompatibility and immunogenetics) Only

## 2022-07-08 NOTE — Patient Instructions (Addendum)
Medication Changes:  START Entresto 49-51mg  (1 tab). Take one tablet, two times a day.   Lab Work:  Labs done today, your results will be available in MyChart, we will contact you for abnormal readings.  Special Instructions // Education:  Do the following things EVERYDAY: Weigh yourself in the morning before breakfast. Write it down and keep it in a log. Take your medicines as prescribed Eat low salt foods--Limit salt (sodium) to 2000 mg per day.  Stay as active as you can everyday Limit all fluids for the day to less than 2 liters  Follow-Up in: 3 weeks with general cardiology and Dr. Alanson Puls at [facility] office. [phone]

## 2022-07-09 ENCOUNTER — Telehealth: Payer: Self-pay

## 2022-07-09 LAB — T3, FREE: T3, Free: 2.4 pg/mL (ref 2.0–4.4)

## 2022-07-09 NOTE — Telephone Encounter (Signed)
Spoke with Surveyor, mining at [employer]. There are 2 statins on profile.  Per OV pt was prescribed Rosuvastatin. Pt sent message to PCP and Atorvastatin was added (07/08/22 #90 3 RF). The Rosuvastatin was dc'd 07/08/22 By Bertram Denver NP "pt preference" Marvia Pickles at [facility] and advised her that the rosuvastatin was dc'd 07/08/22 "pt preference."

## 2022-07-12 ENCOUNTER — Telehealth (HOSPITAL_COMMUNITY): Payer: Self-pay | Admitting: Cardiology

## 2022-07-12 DIAGNOSIS — I5022 Chronic systolic (congestive) heart failure: Secondary | ICD-10-CM

## 2022-07-12 NOTE — Telephone Encounter (Signed)
Upcoming appt updated

## 2022-07-12 NOTE — Telephone Encounter (Signed)
-----   Message from Allayne Butcher, [place] sent at 07/11/2022  2:38 PM EDT ----- TSH elevated but free T3/T4 levels ok. This needs to be monitored. Repeat TSH, Free T3/T4 in 6 wks.

## 2022-07-15 ENCOUNTER — Ambulatory Visit (HOSPITAL_COMMUNITY)
Admission: RE | Admit: 2022-07-15 | Discharge: 2022-07-15 | Disposition: A | Discharge status: Home / Self Care | Payer: BLUE CROSS/BLUE SHIELD | Source: Ambulatory Visit | Attending: Family Medicine | Admitting: Family Medicine

## 2022-07-15 DIAGNOSIS — I5022 Chronic systolic (congestive) heart failure: Secondary | ICD-10-CM | POA: Diagnosis present

## 2022-07-15 LAB — BASIC METABOLIC PANEL
Anion gap: 10 (ref 5–15)
BUN: 22 mg/dL (ref 8–23)
CO2: 25 mmol/L (ref 22–32)
Calcium: 9.4 mg/dL (ref 8.9–10.3)
Chloride: 103 mmol/L (ref 98–111)
Creatinine, Ser: 1.15 mg/dL (ref 0.61–1.24)
GFR, Estimated: >60 mL/min (ref >60)
Glucose, Bld: 190 mg/dL — ABNORMAL HIGH (ref 70–99)
Potassium: 4.8 mmol/L (ref 3.5–5.1)
Sodium: 138 mmol/L (ref 135–145)

## 2022-07-15 LAB — T4, FREE: Free T4: 0.82 ng/dL (ref 0.61–1.12)

## 2022-07-15 LAB — TSH: TSH: 6.052 u[IU]/mL — ABNORMAL HIGH (ref 0.350–4.500)

## 2022-07-16 ENCOUNTER — Telehealth: Payer: Self-pay | Admitting: Cardiology

## 2022-07-16 DIAGNOSIS — I484 Atypical atrial flutter: Secondary | ICD-10-CM

## 2022-07-16 LAB — T3, FREE: T3, Free: 2.5 pg/mL (ref 2.0–4.4)

## 2022-07-16 MED ORDER — ELIQUIS 5 MG PO TABS: 5.0000 mg | ORAL_TABLET | Freq: Two times a day (BID) | ORAL | 1 refills | Status: DC

## 2022-07-16 NOTE — Telephone Encounter (Signed)
Prescription refill request for Eliquis received. Indication: Afib  Last office visit: 06/27/22 Lalla Brothers) Scr: 1.15 (07/15/22)  Age: 61 Weight: 98.8kg  Appropriate dose and refill sent to requested pharmacy.

## 2022-07-16 NOTE — Telephone Encounter (Signed)
*  STAT* If patient is at the pharmacy, call can be transferred to refill team.   1. Which medications need to be refilled? (please list name of each medication and dose if known) ELIQUIS 5 MG TABS tablet  2. Which pharmacy/location (including street and city if local pharmacy) is medication to be sent to? [id number] - [place], [place] - [address]  3. Do they need a 30 day or 90 day supply? 90   Patient is out of medication

## 2022-07-24 ENCOUNTER — Telehealth: Payer: Self-pay | Admitting: Cardiology

## 2022-07-24 DIAGNOSIS — I5022 Chronic systolic (congestive) heart failure: Secondary | ICD-10-CM

## 2022-07-24 NOTE — Telephone Encounter (Signed)
Pt c/o swelling: STAT is pt has developed SOB within 24 hours  If swelling, where is the swelling located?   None  How much weight have you gained and in what time span? 9 lbs  Have you gained 3 pounds in a day or 5 pounds in a week?  9lbs in a week  Do you have a log of your daily weights (if so, list)?   Yes  Are you currently taking a fluid pill?   No  Are you currently SOB?   No  Have you traveled recently?   No  Patient called concerned about his weight gain and whether it could be heart related.

## 2022-07-24 NOTE — Telephone Encounter (Signed)
Corey Perkins, can you get [name] in to see a cardiologist to assist with his HF management?  Thanks!  [facility] referral placed.

## 2022-07-24 NOTE — Telephone Encounter (Signed)
Attempted to call patient. Unable to leave voicemail.  

## 2022-07-24 NOTE — Telephone Encounter (Signed)
Patient is returning call.  °

## 2022-07-26 ENCOUNTER — Telehealth (HOSPITAL_COMMUNITY): Payer: Self-pay | Admitting: Surgery

## 2022-07-26 NOTE — Telephone Encounter (Signed)
I called patient to review results and recommendations per provider.  He tells me that he will contact PCP and schedule an appt to follow-up elevated TSH.

## 2022-07-26 NOTE — Telephone Encounter (Signed)
-----   Message from Allayne Butcher, [place] sent at 07/23/2022  8:28 AM EDT ----- TSH elevated but Free T3/T4 within normal limits. This will need to be monitored. Recommend f/u labs w/ PCP in 2-3 months to recheck thyroid function.

## 2022-07-28 ENCOUNTER — Other Ambulatory Visit: Payer: Self-pay | Admitting: Nurse Practitioner

## 2022-07-28 DIAGNOSIS — R7989 Other specified abnormal findings of blood chemistry: Secondary | ICD-10-CM

## 2022-07-30 ENCOUNTER — Encounter: Payer: Self-pay | Admitting: Internal Medicine

## 2022-07-30 ENCOUNTER — Ambulatory Visit: Payer: BLUE CROSS/BLUE SHIELD | Admitting: Internal Medicine

## 2022-07-30 VITALS — BP 110/68 | HR 60 | Ht 73.0 in | Wt 215.8 lb

## 2022-07-30 DIAGNOSIS — I4891 Unspecified atrial fibrillation: Secondary | ICD-10-CM

## 2022-07-30 DIAGNOSIS — I5022 Chronic systolic (congestive) heart failure: Secondary | ICD-10-CM

## 2022-07-30 MED ORDER — ENTRESTO 24-26 MG PO TABS: 1.0000 | ORAL_TABLET | Freq: Two times a day (BID) | ORAL | 3 refills | Status: DC

## 2022-07-30 NOTE — Patient Instructions (Signed)
Medication Instructions:  Decrease Entresto to 24/26 mg  twice a day   *If you need a refill on your cardiac medications before your next appointment, please call your pharmacy*   Lab Work:  If you have labs (blood work) drawn today and your tests are completely normal, you will receive your results only by: MyChart Message (if you have MyChart) OR A paper copy in the mail If you have any lab test that is abnormal or we need to change your treatment, we will call you to review the results.   Testing/Procedures:    Follow-Up: At [facility], you and your health needs are our priority.  As part of our continuing mission to provide you with exceptional heart care, we have created designated Provider Care Teams.  These Care Teams include your primary Cardiologist (physician) and Advanced Practice Providers (APPs -  Physician Assistants and Nurse Practitioners) who all work together to provide you with the care you need, when you need it.  We recommend signing up for the patient portal called "MyChart".  Sign up information is provided on this After Visit Summary.  MyChart is used to connect with patients for Virtual Visits (Telemedicine).  Patients are able to view lab/test results, encounter notes, upcoming appointments, etc.  Non-urgent messages can be sent to your provider as well.   To learn more about what you can do with MyChart, go to [email or web address].     Important Information About Sugar

## 2022-07-30 NOTE — Progress Notes (Signed)
Cardiology Office Note   Date:  08/02/2022   ID:  Corey Perkins, DOB 09/18/1961, MRN [id number]  PCP:  Corey Rigg, NP  Cardiologist:   Corey Pates, MD    Pt presents for follow up of HFrEF and atrial arrhythmias      History of Present Illness: Corey Perkins is Corey 60 y.o. male with Corey history of atrial flutter (s/p ablation attemtp in 2016 and then 2022), SVT (03/2022), MV dz (s/p annuloplasty ring in 2016), cardiomyopathy (remote LVEF of 35%; echo in 2021 LVEF 50 to 55%), Cardiac cath in 2015 showed minimal CAD; PET scan in 2021 showed small reversible defect.  The pt is s/p ICD implant in 2016([employer] Fortify Assura) for primary prevention.   He received many inappropriate shocks in 2022 until he underwent flutter ablation. The pt also has Corey hx of HTN, DM  Hx of EtOH abuse, and hep C  (AB positive, RNA negative)    The pt moved to [place] from [place] in May 2023.  Previously followed in [place] By Dr Corey Perkins)   The pt was seen in ED on 05/28/22 with tachycardia x 24 hours   EKG with atrial flutter   Treated with diltiazem and then cardioverted.   He had been off of Eliquis due to hemoptysis .  He resumed it.    On 6/4 he was admitted afte being shocked   Device interrogation showed atrial flutter  Atypical.  EP evaluated  Amiodarone had been started.    He was overdrive paced on 6/7 at 190 msec     Sent home on 400 bid amio x 4 days and then 200 daily    Since d/c he has been seen by Corey Perkins  Added Jardiance,  Continued carvedilol.  He was seen by Corey Perkins on 6/29   LIght headed.  Weak    WOrried about ICD.     Plan was for ablation of flutter and intermitt afib that had been picked up.     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   No PND    Current Meds  Medication Sig   amiodarone (PACERONE) 200 MG tablet Take 1 tablet (200 mg total) by mouth daily.   atorvastatin (LIPITOR) 20 MG tablet Take 1 tablet (20 mg total) by mouth daily.   carvedilol (COREG) 25 MG tablet Take 25 mg by mouth 2 (two) times daily.   ELIQUIS 5 MG TABS tablet Take 1 tablet (5 mg total) by mouth 2 (two) times daily.   empagliflozin (JARDIANCE) 10 MG TABS tablet Take 1 tablet (10 mg total) by mouth daily before breakfast.   metFORMIN (GLUCOPHAGE) 1000 MG tablet Take 1,000 mg by mouth 2 (two) times daily.   Multiple Vitamins-Minerals (CENTRUM MEN) TABS Take 1 tablet by mouth daily.   sacubitril-valsartan (ENTRESTO) 24-26 MG Take 1 tablet by mouth 2 (two) times daily.   [DISCONTINUED] sacubitril-valsartan (ENTRESTO) 49-51 MG Take 1 tablet by mouth 2 (two) times daily.  Allergies:   Heparin   Past Medical History:  Diagnosis Date   Corey-fib (HCC)    CHF (congestive heart failure) (HCC)    Diabetes mellitus without complication (HCC)    Hepatitis C    HTN (hypertension)    ICD (implantable cardioverter-defibrillator) in place    Status post mitral valve annuloplasty     Past Surgical History:  Procedure Laterality Date   ICD IMPLANT     KNEE SURGERY     VALVE REPLACEMENT       Social History:  The patient  reports that he quit smoking about 10 months ago. His smoking use included cigarettes. He has never used smokeless tobacco. He reports that he does not currently use alcohol. He reports that he does not currently use drugs after having used the following drugs: Marijuana.   Family History:  The patient's family history includes Hypertension in his father.    ROS:  Please see the history of present illness. All other systems are reviewed and  Negative to the above problem except as noted.    PHYSICAL EXAM: VS:  BP 110/68   Pulse 60   Ht 6\' 1"  (1.854 m)   Wt 215 lb 12.8 oz (97.9 kg)   SpO2  98%   BMI 28.47 kg/m   GEN: Well nourished, well developed, in no acute distress  HEENT: normal  Neck: no JVD, carotid bruits   Cardiac: RRR; no murmurs   No LE edema  Respiratory:  clear to auscultation bilaterally GI: soft, nontender, nondistended, + BS  No hepatomegaly  MS: no deformity Moving all extremities   Skin: warm and dry, no rash Neuro:  Strength and sensation are intact Psych: euthymic mood, full affect   EKG:  EKG is ordered today.  SR   Echo   06/03/22  eft ventricular ejection fraction, by estimation, is 40 to 45%. The left ventricle has mildly decreased function. The left ventricle demonstrates global hypokinesis. There is mild concentric left ventricular hypertrophy. Indeterminate diastolic filling due to E-Corey fusion. 1. Right ventricular systolic function is mildly reduced. The right ventricular size is normal. 2. The mitral valve has been repaired/replaced. No evidence of mitral valve regurgitation. No evidence of mitral stenosis. There is Corey prosthetic annuloplasty ring present in the mitral position. 3. 4. Tricuspid valve regurgitation is mild to moderate. The aortic valve is tricuspid. Aortic valve regurgitation is not visualized. Aortic valve sclerosis is present, with no evidence of aortic valve stenosis. 5. There is borderline dilatation of the aortic root, measuring 37 mm. There is mild dilatation of the ascending aorta, measuring 38 mm. 6. The inferior vena cava is normal in size with greater than 50% respiratory variability, suggesting right atrial pressure of 3 mmHg.   Lipid Panel    Component Value Date/Time   CHOL 191 06/04/2022 0403   TRIG 187 (H) 06/04/2022 0403   HDL 36 (L) 06/04/2022 0403   CHOLHDL 5.3 06/04/2022 0403   VLDL 37 06/04/2022 0403   LDLCALC 118 (H) 06/04/2022 0403      Wt Readings from Last 3 Encounters:  07/30/22 215 lb 12.8 oz (97.9 kg)  07/08/22 218 lb 6.4 oz (99.1 kg)  07/08/22 217 lb 12.8 oz (98.8 kg)       ASSESSMENT AND PLAN:  1  Dizziness, fatigue  Concerning for hypotension, overmedicatoin   I would recomm backing down some on Entresto to 24/26   Keep on carvedilol given rhythm.  Keep on Jardiance  Keep track of BP over next several days and communicate back to clinc via mychart or call If dizziness worsens, call    Sit.   Avoid falling  I do not think spells represent recurrence of arrhythmia  2 Rhythm IN SR  today   On amiodarone and carvedilol.  3   Nonischemic cardiomyopathy.   Volume status looks good today    Class II NYHA CHF.    I would not advance regimen now given dizziness   Follow   Goal to get past ablation, reeval after   4  HL   Keep on atorvastatin    LDL 118 and HDL 36 and trig 187 in June   Will need to follow      Current medicines are reviewed at length with the patient today.  The patient does not have concerns regarding medicines.  Signed, Corey Pates, MD  08/02/2022 10:31 PM    [facility] [address] [place], [place], [place]  [address] Phone: [phone]; Fax: [phone]

## 2022-08-05 ENCOUNTER — Telehealth: Payer: Self-pay

## 2022-08-05 NOTE — Telephone Encounter (Signed)
I spoke with the pt and he reports that since we have decreased his Entresto dose he has been feeling much better.. he says he is not having as much fatigue in the morning and he is less dizzy with position changes... he has not checked his BP but he will check it the next few days a few hours after taking his morning meds and My Chart [place] his readings.

## 2022-08-05 NOTE — Telephone Encounter (Signed)
Plan to call pt this afternoon for follow up.

## 2022-08-05 NOTE — Telephone Encounter (Signed)
-----   Message from Pricilla Riffle, MD sent at 08/02/2022 10:40 PM EDT ----- Please contact patient this week re BP, symptoms now that entresto decreased

## 2022-08-06 NOTE — Telephone Encounter (Signed)
Patient ablation date moved up from waitlist to Aug 24 arrival at 5:30 am and new lab date Aug 10. CT will move up scan time and EP schedule will move up post op visits.  Patient updated printed instructions at home with new dates and time.  Patient verbalized understanding with read back.

## 2022-08-08 ENCOUNTER — Other Ambulatory Visit: Payer: BLUE CROSS/BLUE SHIELD

## 2022-08-08 ENCOUNTER — Ambulatory Visit: Payer: BLUE CROSS/BLUE SHIELD | Admitting: General Practice

## 2022-08-08 DIAGNOSIS — I4891 Unspecified atrial fibrillation: Secondary | ICD-10-CM

## 2022-08-08 DIAGNOSIS — Z01818 Encounter for other preprocedural examination: Secondary | ICD-10-CM

## 2022-08-08 LAB — CBC WITH DIFFERENTIAL/PLATELET
Basophils Absolute: 0 10*3/uL (ref 0.0–0.2)
Basos: 0 %
EOS (ABSOLUTE): 0.2 10*3/uL (ref 0.0–0.4)
Eos: 3 %
Hematocrit: 38.4 % (ref 37.5–51.0)
Hemoglobin: 13.1 g/dL (ref 13.0–17.7)
Lymphocytes Absolute: 2.1 10*3/uL (ref 0.7–3.1)
Lymphs: 27 %
MCH: 32.3 pg (ref 26.6–33.0)
MCHC: 34.1 g/dL (ref 31.5–35.7)
MCV: 95 fL (ref 79–97)
Monocytes Absolute: 0.5 10*3/uL (ref 0.1–0.9)
Monocytes: 7 %
Neutrophils Absolute: 5.1 10*3/uL (ref 1.4–7.0)
Neutrophils: 63 %
Platelets: 110 10*3/uL — ABNORMAL LOW (ref 150–450)
RBC: 4.05 x10E6/uL — ABNORMAL LOW (ref 4.14–5.80)
RDW: 13.4 % (ref 11.6–15.4)
WBC: 8 10*3/uL (ref 3.4–10.8)

## 2022-08-08 LAB — BASIC METABOLIC PANEL
BUN/Creatinine Ratio: 19 (ref 10–24)
BUN: 21 mg/dL (ref 8–27)
CO2: 26 mmol/L (ref 20–29)
Calcium: 9.7 mg/dL (ref 8.6–10.2)
Chloride: 103 mmol/L (ref 96–106)
Creatinine, Ser: 1.1 mg/dL (ref 0.76–1.27)
Glucose: 189 mg/dL — ABNORMAL HIGH (ref 70–99)
Potassium: 4.5 mmol/L (ref 3.5–5.2)
Sodium: 138 mmol/L (ref 134–144)
eGFR: 76 mL/min/{1.73_m2} (ref >59)

## 2022-08-16 ENCOUNTER — Telehealth (HOSPITAL_COMMUNITY): Payer: Self-pay | Admitting: *Deleted

## 2022-08-16 ENCOUNTER — Telehealth: Payer: Self-pay | Admitting: Cardiology

## 2022-08-16 NOTE — Telephone Encounter (Signed)
Pt called me to  inform me of conversation w/ hospital. Since financial paperwork sent, and this is in the process, pt can proceed with procedure if wishes. Pt understands that if financial assistance is denied, then he will be responsible for cost. Pt appreciative of my time/call.

## 2022-08-16 NOTE — Telephone Encounter (Signed)
Reaching out to patient to offer assistance regarding upcoming cardiac imaging study; pt verbalizes understanding of appt date/time, parking situation and where to check in, pre-test NPO status, and verified current allergies; name and call back number provided for further questions should they arise  Larey Brick RN Navigator Cardiac Imaging [facility] [phone] office [phone] cell  Patient aware to arrive at 11:15am.

## 2022-08-16 NOTE — Telephone Encounter (Signed)
   [place] with CT dept called, she said, pt called her and was told that pt's insurance denying his CT and ablation. Pt would like to know what needs to d next. [place] said to call back the pt instead

## 2022-08-16 NOTE — Telephone Encounter (Signed)
Spoke with pt about 12:15. Pt reports someone from the hospital is mailing him financial assistance paperwork. Pt would like to proceed with CT & ablation. Advised pt to speak w/ hospital billing about self-pay cost and if something he still wants to proceed w/ and pay for. Pt and I agreed to follow up this afternoon to determine if proceeding vs. Rescheduling after assistance is complete. Patient verbalized understanding and agreeable to plan.

## 2022-08-19 ENCOUNTER — Telehealth: Payer: Self-pay | Admitting: Cardiology

## 2022-08-19 ENCOUNTER — Ambulatory Visit (HOSPITAL_BASED_OUTPATIENT_CLINIC_OR_DEPARTMENT_OTHER): Payer: BLUE CROSS/BLUE SHIELD

## 2022-08-19 ENCOUNTER — Ambulatory Visit (HOSPITAL_BASED_OUTPATIENT_CLINIC_OR_DEPARTMENT_OTHER): Admission: RE | Admit: 2022-08-19 | Payer: BLUE CROSS/BLUE SHIELD | Source: Ambulatory Visit

## 2022-08-19 NOTE — Telephone Encounter (Signed)
Patient's healthcare plan is out of network and he can not self pay for the procedure. Patient noted that he will be finding in network providers and to cancel appt with Dr. Lalla Brothers.

## 2022-08-19 NOTE — Telephone Encounter (Signed)
Patient is calling stating he is needing to cancel his upcoming procedure if his insurance if not able to help with the cost. He states he had to cancel his CT that was scheduled for today due to insurance not covering it and just found that going through with this procedure would cost him over $100,000. Please advise.

## 2022-08-22 ENCOUNTER — Encounter (HOSPITAL_COMMUNITY): Admission: RE | Payer: Self-pay | Source: Home / Self Care

## 2022-08-22 ENCOUNTER — Ambulatory Visit (HOSPITAL_COMMUNITY): Admission: RE | Admit: 2022-08-22 | Payer: BLUE CROSS/BLUE SHIELD | Source: Home / Self Care | Admitting: Cardiology

## 2022-08-22 SURGERY — ATRIAL FIBRILLATION ABLATION
Anesthesia: General

## 2022-09-06 ENCOUNTER — Other Ambulatory Visit: Payer: BLUE CROSS/BLUE SHIELD

## 2022-09-19 ENCOUNTER — Ambulatory Visit (HOSPITAL_COMMUNITY): Payer: BLUE CROSS/BLUE SHIELD | Admitting: Physician Assistant

## 2022-09-20 ENCOUNTER — Other Ambulatory Visit (HOSPITAL_BASED_OUTPATIENT_CLINIC_OR_DEPARTMENT_OTHER): Payer: BLUE CROSS/BLUE SHIELD

## 2022-09-30 ENCOUNTER — Other Ambulatory Visit (HOSPITAL_COMMUNITY): Payer: Self-pay | Admitting: Adult Health

## 2022-10-02 MED ORDER — EMPAGLIFLOZIN 10 MG PO TABS: 10.0000 mg | ORAL_TABLET | Freq: Every day | ORAL | 3 refills | Status: AC

## 2022-10-25 ENCOUNTER — Ambulatory Visit (HOSPITAL_COMMUNITY): Payer: BLUE CROSS/BLUE SHIELD | Admitting: Physician Assistant

## 2022-11-27 ENCOUNTER — Ambulatory Visit: Payer: BLUE CROSS/BLUE SHIELD | Admitting: Cardiology

## 2022-12-25 ENCOUNTER — Ambulatory Visit: Payer: BLUE CROSS/BLUE SHIELD | Admitting: Cardiology

## 2022-12-31 ENCOUNTER — Other Ambulatory Visit: Payer: Self-pay | Admitting: Cardiology

## 2022-12-31 DIAGNOSIS — I484 Atypical atrial flutter: Secondary | ICD-10-CM

## 2022-12-31 NOTE — Telephone Encounter (Signed)
Prescription refill request for Eliquis received. Indication:Afib  Last office visit: 07/30/22([facility])  Scr: 1.07  Age: 62 Weight: 97.9kg  Appropriate dose and refill sent to requested pharmacy.

## 2023-07-09 ENCOUNTER — Other Ambulatory Visit: Payer: Self-pay | Admitting: Internal Medicine

## 2023-08-04 ENCOUNTER — Other Ambulatory Visit: Payer: Self-pay | Admitting: Internal Medicine

## 2023-09-21 ENCOUNTER — Other Ambulatory Visit: Payer: Self-pay | Admitting: Cardiology

## 2023-09-23 ENCOUNTER — Other Ambulatory Visit: Payer: Self-pay | Admitting: Internal Medicine

## 2023-10-06 ENCOUNTER — Other Ambulatory Visit: Payer: Self-pay | Admitting: Internal Medicine

## 2024-03-09 IMAGING — DX DG CHEST 2V
2 series · 2 of 2 positions shown · non-contrast
Comparison: None Available.

CLINICAL DATA: Tachycardia and chest pain

EXAM:
CHEST - 2 VIEW

[w chest pa]
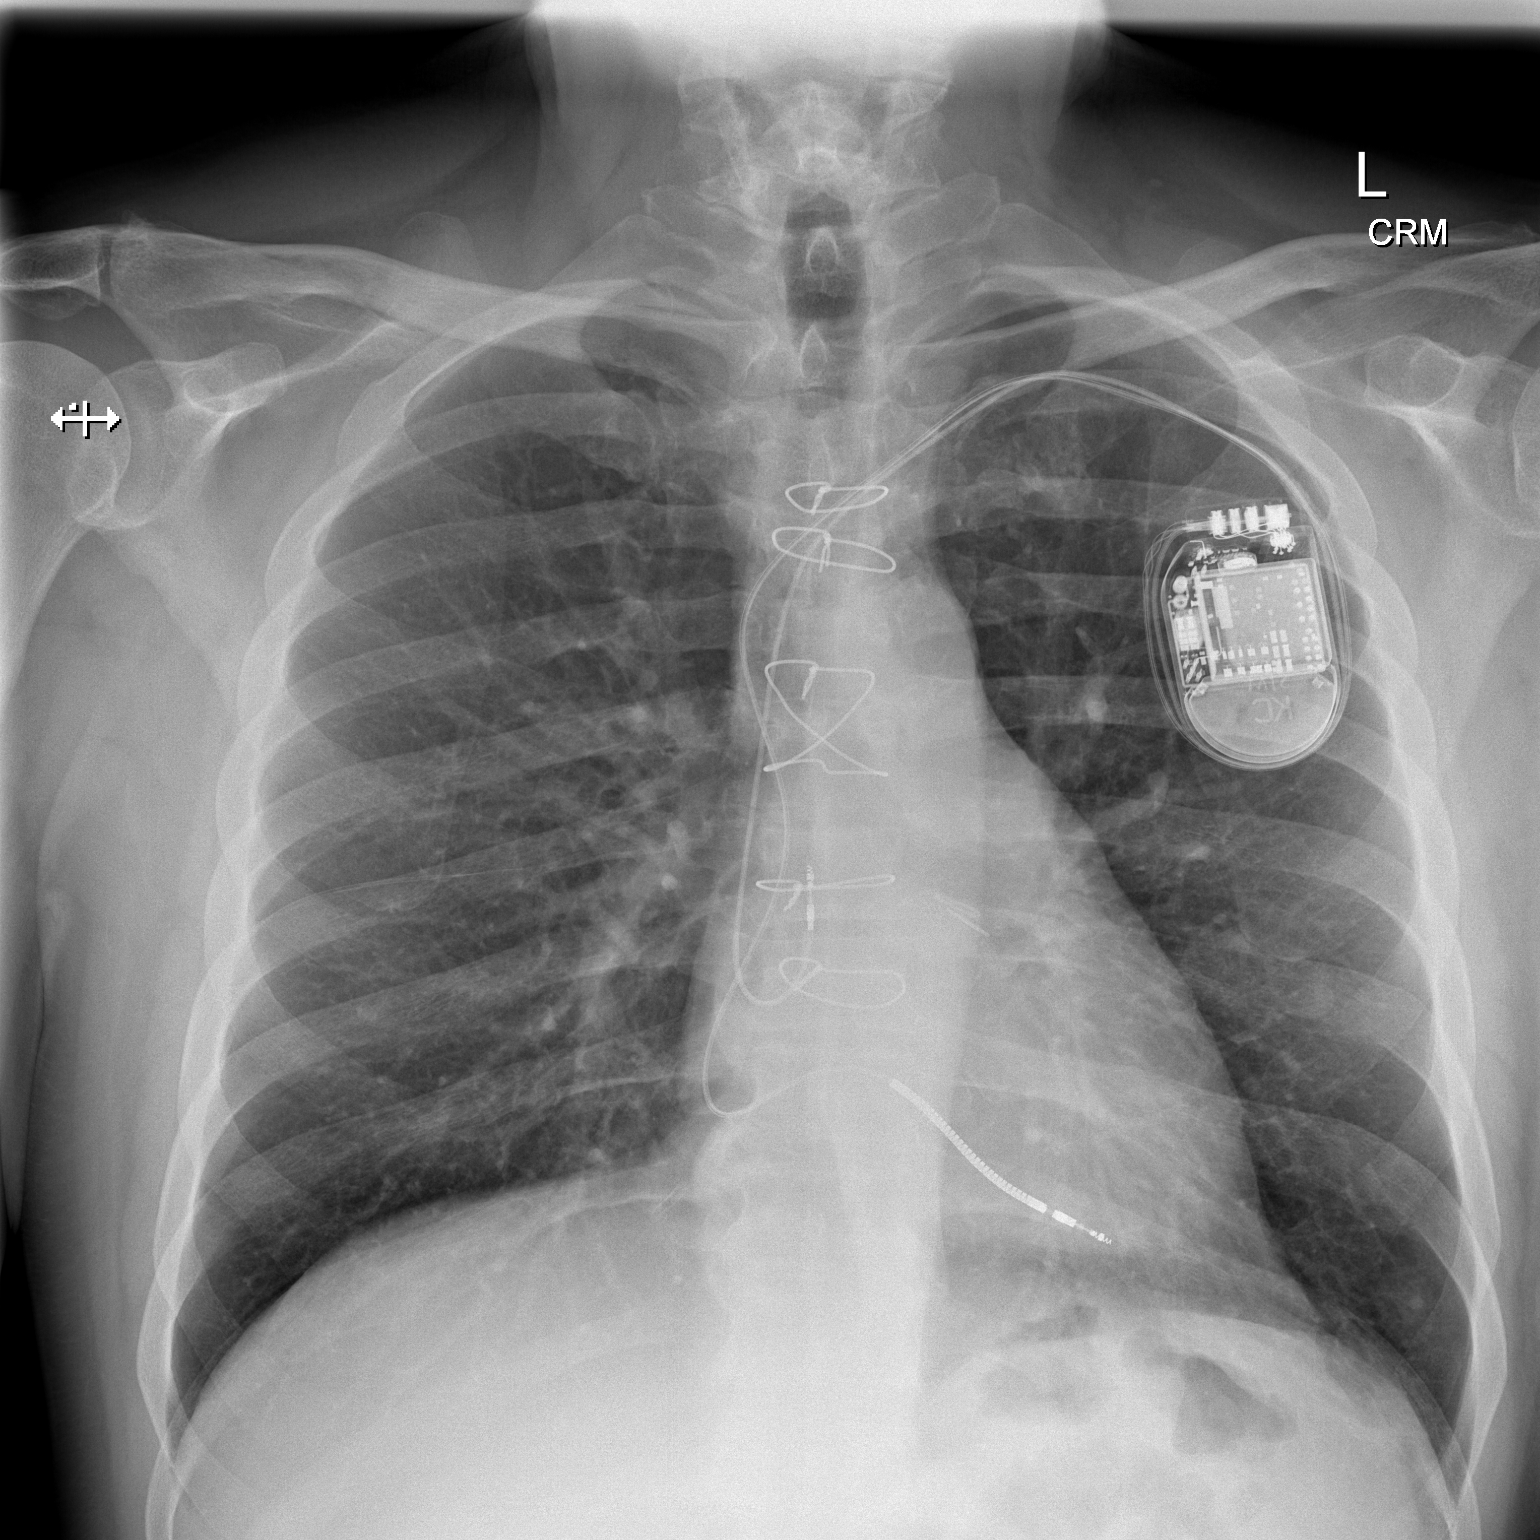

[w chest lat]
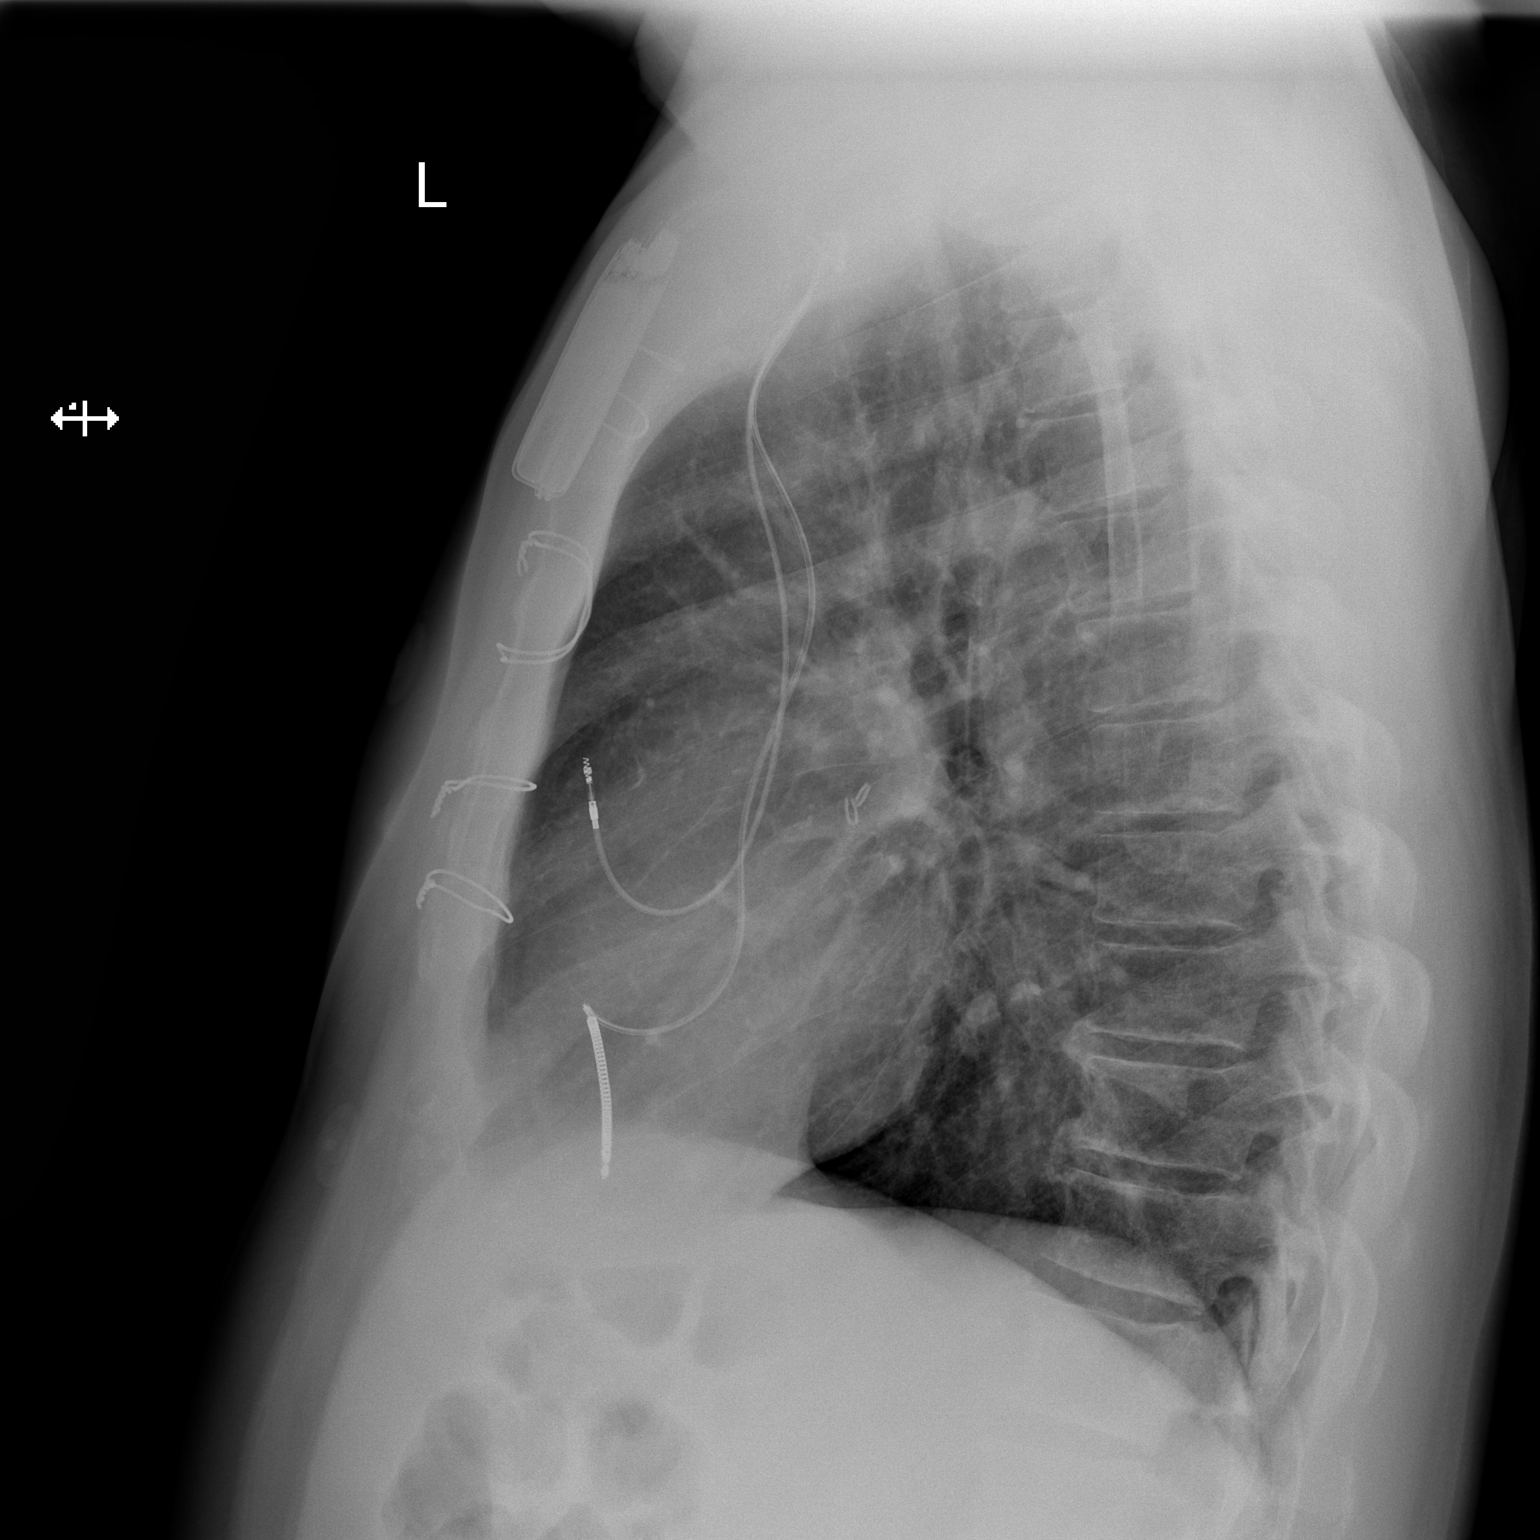

[2 of 2 positions shown; findings below may reference images not displayed]

FINDINGS: Cardiac and mediastinal contours are within normal limits. Left
chest wall AICD with leads projecting over the expected area of the
right atrium and right ventricle. Lungs are clear. No pleural
effusion or pneumothorax.
IMPRESSION: No active cardiopulmonary disease.

## 2024-03-14 IMAGING — CR DG CHEST 2V
2 series · 2 of 2 positions shown · non-contrast
Comparison: None Available.

CLINICAL DATA: cp

EXAM:
CHEST - 2 VIEW

[chest pa]
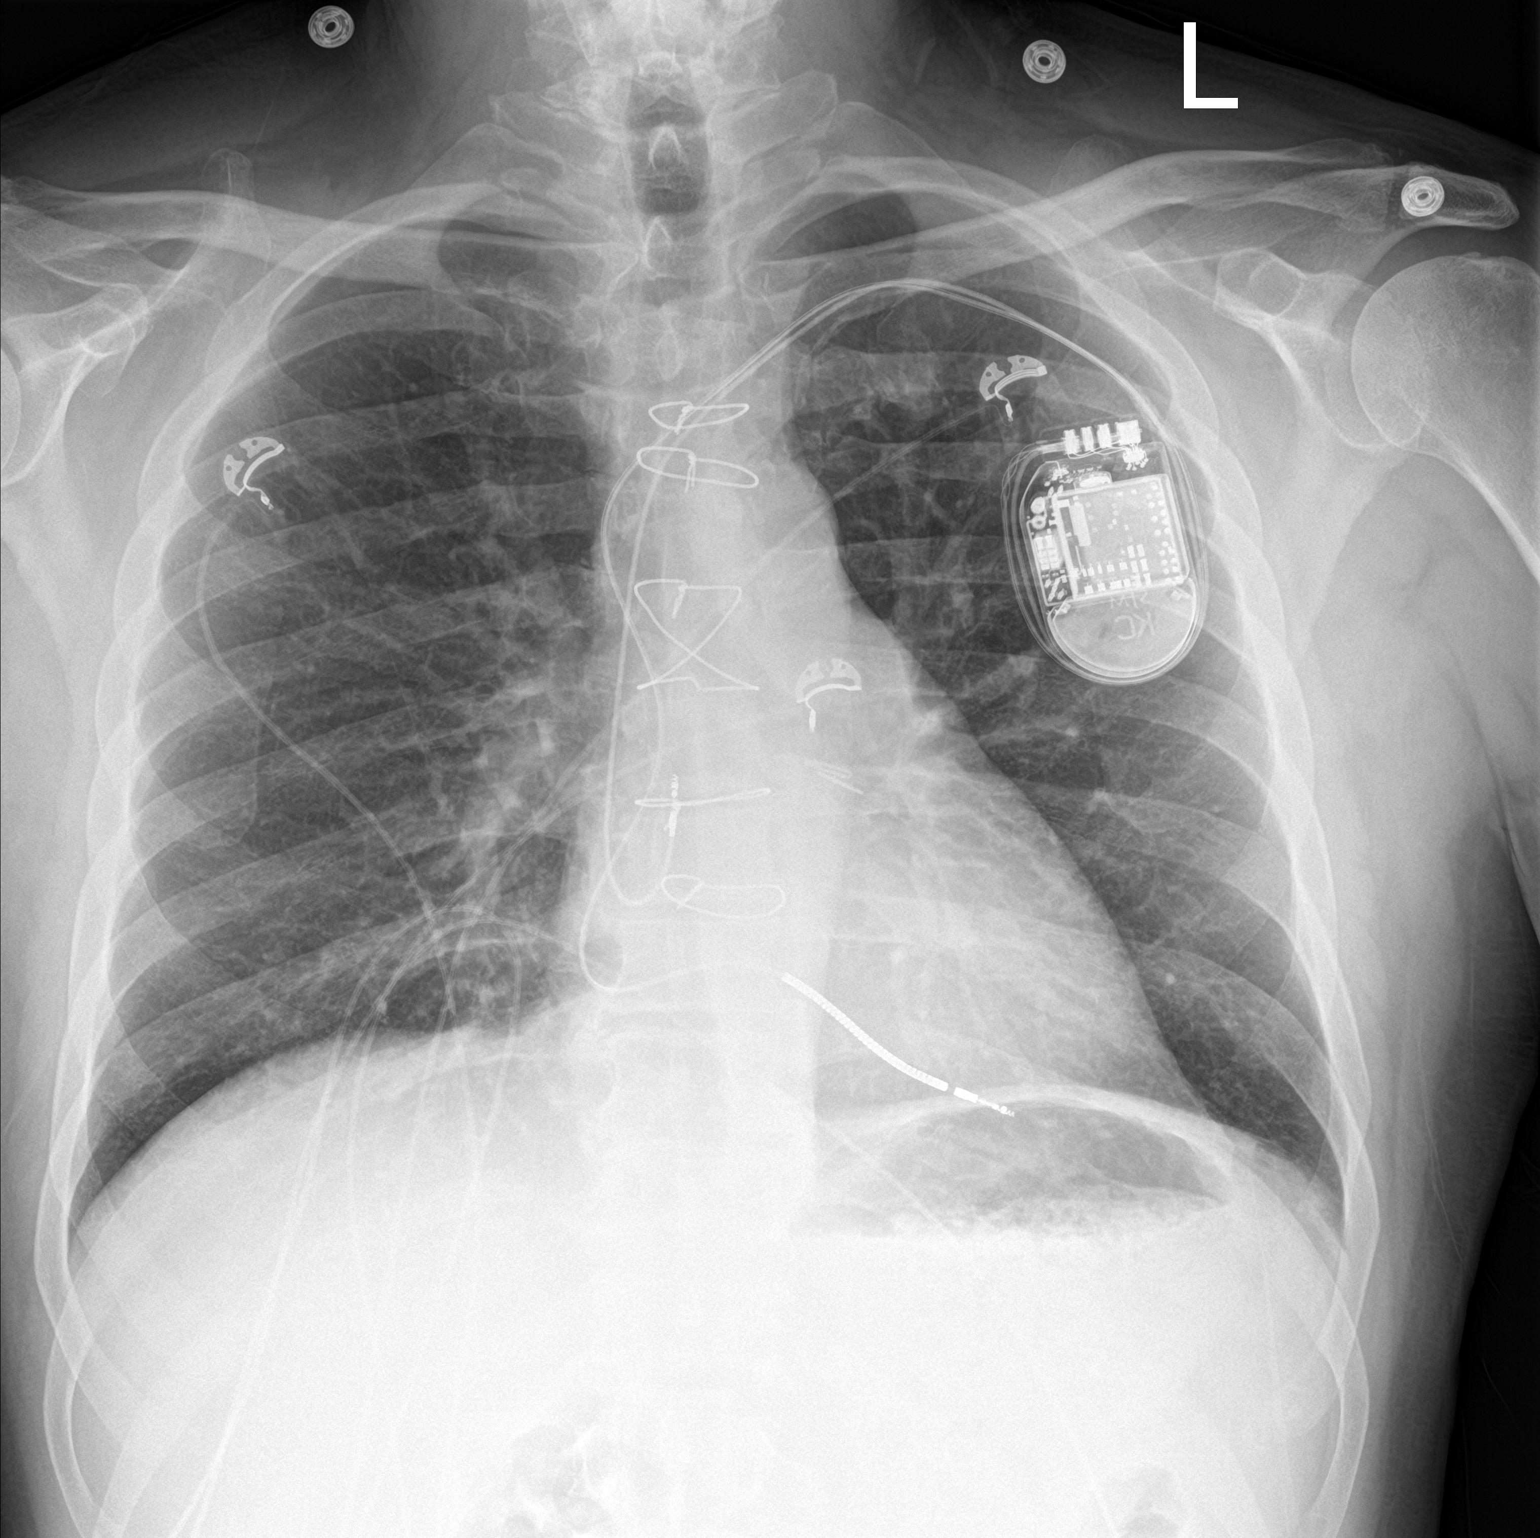

[chest lat]
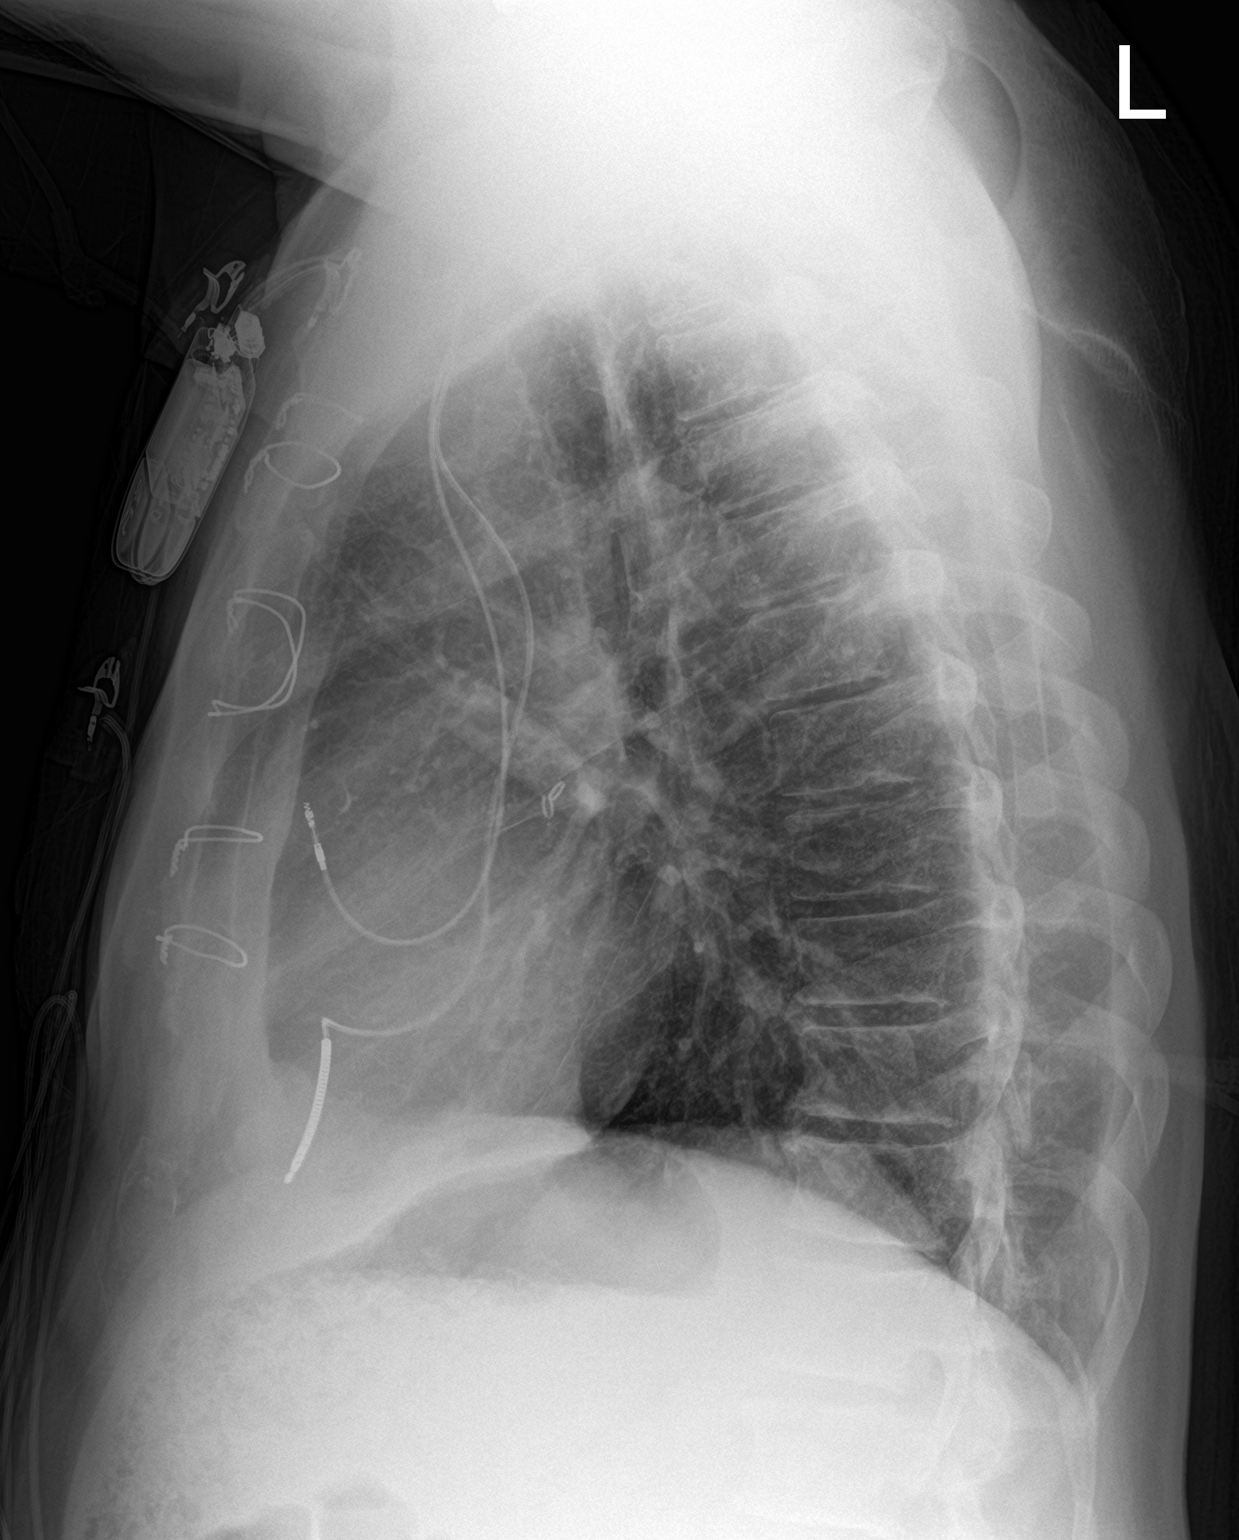

[2 of 2 positions shown; findings below may reference images not displayed]

FINDINGS: Left chest wall dual lead pacemaker/defibrillator in stable
position.

The heart and mediastinal contours are unchanged.

No focal consolidation. No pulmonary edema. No pleural effusion. No
pneumothorax.

No acute osseous abnormality.  Query fractured mid sternotomy wires.
IMPRESSION: No active cardiopulmonary disease.
